# Patient Record
Sex: Male | Born: 1970 | Race: White | Hispanic: No | Smoking: Current every day smoker
Health system: Southern US, Community
[De-identification: ages and names within clinical notes are randomized; demographics above are authoritative.]

## PROBLEM LIST (undated history)

## (undated) DIAGNOSIS — M1990 Unspecified osteoarthritis, unspecified site: Secondary | ICD-10-CM

## (undated) DIAGNOSIS — K219 Gastro-esophageal reflux disease without esophagitis: Secondary | ICD-10-CM

## (undated) DIAGNOSIS — E7800 Pure hypercholesterolemia, unspecified: Secondary | ICD-10-CM

## (undated) DIAGNOSIS — L089 Local infection of the skin and subcutaneous tissue, unspecified: Secondary | ICD-10-CM

## (undated) DIAGNOSIS — Z87442 Personal history of urinary calculi: Secondary | ICD-10-CM

## (undated) DIAGNOSIS — N200 Calculus of kidney: Secondary | ICD-10-CM

## (undated) HISTORY — PX: OTHER SURGICAL HISTORY: SHX169

---

## 2002-12-16 ENCOUNTER — Emergency Department (HOSPITAL_COMMUNITY): Admission: EM | Admit: 2002-12-16 | Discharge: 2002-12-16 | Payer: Self-pay | Admitting: Emergency Medicine

## 2009-10-31 HISTORY — PX: ELBOW BURSA SURGERY: SHX615

## 2010-06-24 ENCOUNTER — Emergency Department (HOSPITAL_COMMUNITY): Admission: EM | Admit: 2010-06-24 | Discharge: 2010-06-24 | Payer: Self-pay | Admitting: Family Medicine

## 2010-11-18 ENCOUNTER — Ambulatory Visit
Admission: RE | Admit: 2010-11-18 | Discharge: 2010-11-18 | Payer: Self-pay | Source: Home / Self Care | Attending: Orthopedic Surgery | Admitting: Orthopedic Surgery

## 2010-11-22 NOTE — Op Note (Signed)
  NAME:  Joel [name], Joel [name]          ACCOUNT NO.:  [id number]  MEDICAL RECORD NO.:  [id number]          PATIENT TYPE:  AMB  LOCATION:  [facility]                         FACILITY:  [facility]  PHYSICIAN:  Marlowe Kays, M.D.  DATE OF BIRTH:  30-Jan-1971  DATE OF PROCEDURE:  11/18/2010 DATE OF DISCHARGE:                              OPERATIVE REPORT   PREOPERATIVE DIAGNOSIS:  Chronic olecranon bursitis, right elbow.  POSTOPERATIVE DIAGNOSIS:  Chronic olecranon bursitis, right elbow.  OPERATION:  Olecranon bursectomy, right elbow.  SURGEON:  Marlowe Kays, M.D.  ASSISTANT:  Nurse.  ANESTHESIA:  General.  INDICATIONS FOR PROCEDURE:  He has had a number of aspirations with steroid injections but a persistence of the chronic swelling.  When he gets swelling, he will have some symptoms of ulnar nerve irritation and we both felt that it was time to treat this surgically.  PROCEDURE:  After satisfactory general anesthesia, a pneumatic tourniquet was applied, the right arm Esmarched nonsterilely, tourniquet inflated and the right arm prepped with DuraPrep from tourniquet to wrist and draped in a sterile field.  A time-out was performed.  With arms across his chest, I marked out a curved incision around the olecranon on the radial side.  Once through the skin and subcutaneous tissue, the bursa became apparent. It was quite deflated in comparison to what we have seen.  There was central nidus of chronic inflammatory tissue which I excised as well as the bursa sac itself. The nidus, in particular, I sent to pathology to check for rheumatoid disease and we also sent portion of the specimen to look for uric acid crystals.  I then deflated the tourniquet and some minor bleeders were coagulated.  I then closed the wound after infiltrating the soft tissues with 0.5% plain Marcaine with interrupted 3-0 Vicryl, subcutaneous tissue with interrupted 4-0 nylon and mattress sutures in the skin.  A  well-padded soft dressing was applied preceded by a Betadine and Adaptic.  He was comfortably placed in a sling.  At the time of this dictation, he was on his way to the PACU in satisfactory condition with no known complications.          ______________________________ Marlowe Kays, M.D.     JA/MEDQ  D:  11/18/2010  T:  11/18/2010  Job:  [id number]  Electronically Signed by Marlowe Kays M.D. on 11/22/2010 12:40:17 PM

## 2012-03-04 ENCOUNTER — Emergency Department (HOSPITAL_BASED_OUTPATIENT_CLINIC_OR_DEPARTMENT_OTHER)
Admission: EM | Admit: 2012-03-04 | Discharge: 2012-03-04 | Disposition: A | Discharge status: Home / Self Care | Payer: Self-pay | Attending: Emergency Medicine | Admitting: Emergency Medicine

## 2012-03-04 ENCOUNTER — Emergency Department (INDEPENDENT_AMBULATORY_CARE_PROVIDER_SITE_OTHER): Payer: Self-pay

## 2012-03-04 ENCOUNTER — Encounter (HOSPITAL_BASED_OUTPATIENT_CLINIC_OR_DEPARTMENT_OTHER): Payer: Self-pay | Admitting: *Deleted

## 2012-03-04 DIAGNOSIS — W540XXA Bitten by dog, initial encounter: Secondary | ICD-10-CM | POA: Insufficient documentation

## 2012-03-04 DIAGNOSIS — S61209A Unspecified open wound of unspecified finger without damage to nail, initial encounter: Secondary | ICD-10-CM | POA: Insufficient documentation

## 2012-03-04 DIAGNOSIS — M79609 Pain in unspecified limb: Secondary | ICD-10-CM

## 2012-03-04 DIAGNOSIS — M869 Osteomyelitis, unspecified: Secondary | ICD-10-CM | POA: Insufficient documentation

## 2012-03-04 DIAGNOSIS — L089 Local infection of the skin and subcutaneous tissue, unspecified: Secondary | ICD-10-CM

## 2012-03-04 MED ORDER — CLINDAMYCIN PHOSPHATE 600 MG/50ML IV SOLN
600.0000 mg | Freq: Once | INTRAVENOUS | Status: AC
  Administered 2012-03-04: 600 mg via INTRAVENOUS
  Filled 2012-03-04: qty 50

## 2012-03-04 MED ORDER — LIDOCAINE HCL 2 % IJ SOLN
INTRAMUSCULAR | Status: AC
  Administered 2012-03-04: 14:00:00
  Filled 2012-03-04: qty 1

## 2012-03-04 MED ORDER — DOXYCYCLINE HYCLATE 100 MG PO CAPS: 100.0000 mg | ORAL_CAPSULE | Freq: Two times a day (BID) | ORAL | Status: DC

## 2012-03-04 NOTE — Discharge Instructions (Signed)
Bone and Joint Infections Joint infections are called septic or infectious arthritis. An infected joint may damage cartilage and tissue very quickly. This may destroy the joint. Bone infections (osteomyelitis) may last for years. Joints may become stiff if left untreated. Bacteria are the most common cause. Other causes include viruses and fungi, but these are more rare. Bone and joint infections usually come from injury or infection elsewhere in your body; the germs are carried to your bones or joints through the bloodstream.  CAUSES   Blood-carried germs from an infection elsewhere in your body can eventually spread to a bone or joint. The germ staphylococcus is the most common cause of both osteomyelitis and septic arthritis.   An injury can introduce germs into your bones or joints.  SYMPTOMS   Weight loss.   Tiredness.   Chills and fever.   Bone or joint pain at rest and with activity.   Tenderness when touching the area or bending the joint.   Refusal to bear weight on a leg or inability to use an arm due to pain.   Decreased range of motion in a joint.   Skin redness, warmth, and tenderness.   Open skin sores and drainage.  RISK FACTORS Children, the elderly, and those with weak immune systems are at increased risk of bone and joint infections. It is more common in people with HIV infections and with people on chemotherapy. People are also at increased risk if they have surgery where metal implants are used to stabilize the bone. Plates, screws, or artificial joints provide a surface that bacteria can stick on. Such a growth of bacteria is called biofilm. The biofilm protects bacteria from antibiotics and bodily defenses. This allows germs to multiply. Other reasons for increased risks include:   Having previous surgery or injury of a bone or joint.   Being on high-dose corticosteroids and immunosuppressive medications that weaken your body's resistance to germs.   Diabetes  and long-standing diseases.   Use of intravenous street drugs.   Being on hemodialysis.   Having a history of urinary tract infections.   Removal of your spleen (splenectomy). This weakens your immunity.   Chronic viral infections such as HIV or AIDS.   Lack of sensation such as paraplegia, quadriplegia, or spina bifida.  DIAGNOSIS   Increased numbers of white blood cells in your blood may indicate infection. Some times your caregivers are able to identify the infecting germs by testing your blood. Inflammatory markers present in your bloodstream such as an erythrocyte sedimentation rate (ESR or sed rate) or c-reactive protein (CRP) can be indicators of deep infection.   Bone scans and X-ray exams are necessary for diagnosing osteomyelitis. They may help your caregiver find the infected areas. Other studies may give more detailed information. They may help detect fluid collections around a joint, abnormal bone surfaces, or be useful in diagnosing septic arthritis. They can find soft tissue swelling and find excess fluid in an infected joint or the adjacent bone. These tests include:   Ultrasound.   CT (computerized tomography).   MRI (magnetic resonance imaging).   The best test for diagnosing a bone or joint infection is an aspiration or biopsy. Your caregiver will usually use a local anesthetic. He or she can then remove tissue from a bone injury or use a needle to take fluids from an infected joint. A local anesthetic medication numbs the area to be biopsied. Often biopsies are done in the operating room under general anesthesia. This   means you will be asleep during the procedure. Tests performed on these samples can identify an infection.  TREATMENT   Treatment can help control long-standing infections, but infections may come back.   Infections can infect any bone or joint at any age.   Bone and joint infections are rarely fatal.   Bone infection left untreated can become a  never-ending infection. It can spread to other areas of your body. It may eventually cause bone death. Reduced limb or joint function can result. In severe cases, this may require removal of a limb. Spinal osteomyelitis is very dangerous. Untreated, it may damage spinal nerves and cause death.   The most common complication of septic arthritis is osteoarthritis with pain and decreased range of motion of the joint.  Some forms of treatment may include:  If the infection is caused by bacteria, it is generally treated with antibiotics. You will likely receive the drugs through a vein (intravenously) for anywhere from 2 to 6 weeks. In some cases, especially with children, oral antibiotics following an initial intravenous dose may be effective. The treatment you receive depends on the:   Type of bacteria.   Location of the infection.   Type of surgery that might be done.   Other health conditions or issues you might have.   Your caregiver may drain soft tissue abscesses or pockets of fluid around infected bones or joints. If you have septic arthritis, your caregiver may use a needle to drain pus from the joint on a daily basis. He or she may use an arthroscope to clean the joint or may need to open the joint surgically to remove damaged tissue and infection. An arthroscope is an instrument like a thin lighted telescope. It can be used to look inside the joint.   Surgery is usually needed if the infection has become long-standing. It may also be needed if there is hardware (such as metal plates, screws, or artificial joints) inside the patient. Sometimes a bone or muscle graft is needed to fill in the open space. This promotes growth of new tissues and better blood flow to the area.  PREVENTION   Clean and disinfect wounds quickly to help prevent the start of a bone or joint infection. Get treatment for any infections to prevent spread to a bone or joint.   Do not smoke. Smoking decreases healing  rates of bone and predisposes to infection.   When given medications that suppress your immune system, use them according to your caregiver's instructions. Do not take more than prescribed for your condition.   Take good care of your feet and skin, especially if you have diabetes, decreased sensation or circulation problems.  SEEK IMMEDIATE MEDICAL CARE IF:   You cannot bear weight on a leg or use an arm, especially following a minor injury. This can be a sign of bone or joint infection.   You think you may have signs or symptoms of a bone or joint infection. Your chance of getting rid of an infection is better if treated early.  Document Released: 10/17/2005 Document Revised: 10/06/2011 Document Reviewed: 09/16/2009 [employer] Patient Information 2012 [employer]. 

## 2012-03-04 NOTE — ED Notes (Signed)
Patient was bitten on L hand one month ago, animal current on vaccinations, took keflx for 7 days bid, and finger started getting better, but finger has become swollen & hurts to touch or move

## 2012-03-04 NOTE — ED Provider Notes (Addendum)
History     CSN: [id number]  Arrival date & time 03/04/12  4098   First MD Initiated Contact with Patient 03/04/12 1020      Chief Complaint  Patient presents with  . Animal Bite    (Consider location/radiation/quality/duration/timing/severity/associated sxs/prior treatment) HPI Comments: Patient was bit by a dog 1 month ago initially the finger got infected and he took Keflex that he had at home after a dental extraction. He initially said it started to look a little better in a week and a half to 2 weeks ago started to swell and drain pus. The patient is now a 6/10 and radiates up the arm. It is worse with been  Patient is a 41 y.o. male presenting with hand pain. The history is provided by the patient.  Hand Pain This is a recurrent problem. Episode onset: 12month ago. The problem occurs constantly. The problem has been gradually worsening. Associated symptoms comments: Redness, drainage and swelling of the left index finger.   . The symptoms are aggravated by bending (palpation). The symptoms are relieved by nothing. He has tried nothing for the symptoms. The treatment provided no relief.    History reviewed. No pertinent past medical history.  History reviewed. No pertinent past surgical history.  No family history on file.  History  Substance Use Topics  . Smoking status: Never Smoker   . Smokeless tobacco: Not on file  . Alcohol Use: Yes      Review of Systems  Constitutional: Negative for fever.  Gastrointestinal: Negative for nausea, vomiting and diarrhea.  Skin: Positive for wound.  All other systems reviewed and are negative.    Allergies  Review of patient's allergies indicates no known allergies.  Home Medications   Current Outpatient Rx  Name Route Sig Dispense Refill  . PREVACID PO Oral Take by mouth.      BP 135/83  Pulse 85  Temp(Src) 99.5 F (37.5 C) (Oral)  Resp 18  Ht 5\' 10"  (1.778 m)  Wt 192 lb (87.091 kg)  BMI 27.55 kg/m2  SpO2  97%  Physical Exam  Nursing note and vitals reviewed. Constitutional: He is oriented to person, place, and time. He appears well-developed and well-nourished. No distress.  HENT:  Head: Normocephalic and atraumatic.  Musculoskeletal:       Hands: Neurological: He is alert and oriented to person, place, and time.  Skin: Skin is warm and dry.  Psychiatric: He has a normal mood and affect. His behavior is normal.    ED Course  Procedures (including critical care time)  Labs Reviewed - No data to display Dg Hand Complete Right  03/04/2012  *RADIOLOGY REPORT*  Clinical Data: Dog bite to the left index finger.  RIGHT HAND - COMPLETE 3+ VIEW 03/04/2012:  Comparison: None.  Findings: Marked soft tissue swelling involving the index finger. Lucency involving the base of the middle phalanx of the index finger, with apparent periosteal new bone formation along its volar aspect.  No other intrinsic osseous abnormalities.  No evidence of acute or subacute fracture or dislocation.  Well-preserved joint spaces.  IMPRESSION: Possible osteomyelitis involving the middle phalanx of the index finger.  Marked soft tissue swelling involving the index finger.  If imaging confirmation is felt necessary, MRI without and with contrast would be suggested.  Original Report Authenticated By: Arnell Sieving, M.D.   INCISION AND DRAINAGE Performed by: Gwyneth Sprout Consent: Verbal consent obtained. Risks and benefits: risks, benefits and alternatives were discussed Type: abscess  Body area:  Left index finger  Anesthesia: Digital block  Local anesthetic: lidocaine 2 % without epinephrine  Anesthetic total: 3 ml  Complexity: Simple   Drainage: purulent  Drainage amount:   Packing material: none Patient tolerance: Patient tolerated the procedure well with no immediate complications.     1. Osteomyelitis of finger   2. Dog bite   3. Finger infection       MDM   Patient with a dog bite  from a month ago. He had initially taken to Keflex he had at home to treat the infection and then 2 weeks ago it started getting worse. On exam here the finger is swollen and draining a purulent drainage. He denies any systemic symptoms but states the pain is starting to get worse. Plain film shows concern for possible osteomyelitis. Spoke with Dr. Lajoyce Corners hand surgery he recommended an IV dose of clindamycin and then sending the patient home with doxycycline. He will see the patient in his office tomorrow.  Tetanus shot is up-to-date      Gwyneth Sprout, MD 03/04/12 1202  Gwyneth Sprout, MD 03/04/12 1610  Gwyneth Sprout, MD 03/04/12 1309  Gwyneth Sprout, MD 03/04/12 1316

## 2012-03-06 ENCOUNTER — Encounter (HOSPITAL_COMMUNITY): Payer: Self-pay | Admitting: Pharmacy Technician

## 2012-03-07 ENCOUNTER — Other Ambulatory Visit (HOSPITAL_COMMUNITY): Payer: Self-pay | Admitting: Orthopedic Surgery

## 2012-03-07 ENCOUNTER — Encounter (HOSPITAL_COMMUNITY): Payer: Self-pay | Admitting: *Deleted

## 2012-03-08 ENCOUNTER — Ambulatory Visit (HOSPITAL_COMMUNITY): Payer: Self-pay

## 2012-03-08 ENCOUNTER — Inpatient Hospital Stay (HOSPITAL_COMMUNITY)
Admission: RE | Admit: 2012-03-08 | Discharge: 2012-03-09 | DRG: 497 | Disposition: A | Discharge status: Home / Self Care | Payer: Self-pay | Source: Ambulatory Visit | Attending: Orthopedic Surgery | Admitting: Orthopedic Surgery

## 2012-03-08 ENCOUNTER — Encounter (HOSPITAL_COMMUNITY)
Admission: RE | Disposition: A | Discharge status: Home / Self Care | Payer: Self-pay | Source: Ambulatory Visit | Attending: Orthopedic Surgery

## 2012-03-08 ENCOUNTER — Ambulatory Visit (HOSPITAL_COMMUNITY): Payer: Self-pay | Admitting: Certified Registered"

## 2012-03-08 ENCOUNTER — Encounter (HOSPITAL_COMMUNITY): Payer: Self-pay | Admitting: Certified Registered"

## 2012-03-08 ENCOUNTER — Encounter (HOSPITAL_COMMUNITY): Payer: Self-pay | Admitting: *Deleted

## 2012-03-08 DIAGNOSIS — L03019 Cellulitis of unspecified finger: Secondary | ICD-10-CM | POA: Diagnosis present

## 2012-03-08 DIAGNOSIS — L02519 Cutaneous abscess of unspecified hand: Secondary | ICD-10-CM | POA: Diagnosis present

## 2012-03-08 DIAGNOSIS — K219 Gastro-esophageal reflux disease without esophagitis: Secondary | ICD-10-CM | POA: Diagnosis present

## 2012-03-08 DIAGNOSIS — M869 Osteomyelitis, unspecified: Principal | ICD-10-CM | POA: Diagnosis present

## 2012-03-08 HISTORY — DX: Local infection of the skin and subcutaneous tissue, unspecified: L08.9

## 2012-03-08 HISTORY — DX: Gastro-esophageal reflux disease without esophagitis: K21.9

## 2012-03-08 HISTORY — PX: I & D EXTREMITY: SHX5045

## 2012-03-08 LAB — COMPREHENSIVE METABOLIC PANEL
ALT: 25 U/L (ref 0–53)
Albumin: 3.5 g/dL (ref 3.5–5.2)
Alkaline Phosphatase: 55 U/L (ref 39–117)
BUN: 14 mg/dL (ref 6–23)
Chloride: 105 mEq/L (ref 96–112)
Glucose, Bld: 89 mg/dL (ref 70–99)
Potassium: 4.3 mEq/L (ref 3.5–5.1)
Total Bilirubin: 0.3 mg/dL (ref 0.3–1.2)

## 2012-03-08 LAB — CBC
HCT: 45.6 % (ref 39.0–52.0)
Hemoglobin: 15.8 g/dL (ref 13.0–17.0)
RDW: 13.4 % (ref 11.5–15.5)
WBC: 11.6 10*3/uL — ABNORMAL HIGH (ref 4.0–10.5)

## 2012-03-08 LAB — GRAM STAIN

## 2012-03-08 LAB — PROTIME-INR: Prothrombin Time: 11.8 seconds (ref 11.6–15.2)

## 2012-03-08 LAB — APTT: aPTT: 26 seconds (ref 24–37)

## 2012-03-08 SURGERY — IRRIGATION AND DEBRIDEMENT EXTREMITY
Anesthesia: General | Site: Finger | Laterality: Left | Wound class: Dirty or Infected

## 2012-03-08 MED ORDER — HYDROMORPHONE HCL PF 1 MG/ML IJ SOLN
INTRAMUSCULAR | Status: AC
  Filled 2012-03-08: qty 1

## 2012-03-08 MED ORDER — CEFAZOLIN SODIUM 1-5 GM-% IV SOLN
INTRAVENOUS | Status: AC
  Filled 2012-03-08: qty 50

## 2012-03-08 MED ORDER — METOCLOPRAMIDE HCL 5 MG/ML IJ SOLN: 5.0000 mg | Freq: Three times a day (TID) | INTRAMUSCULAR | Status: DC | PRN

## 2012-03-08 MED ORDER — ONDANSETRON HCL 4 MG/2ML IJ SOLN: 4.0000 mg | Freq: Four times a day (QID) | INTRAMUSCULAR | Status: DC | PRN

## 2012-03-08 MED ORDER — SODIUM CHLORIDE 0.9 % IJ SOLN
10.0000 mL | INTRAMUSCULAR | Status: DC | PRN
  Administered 2012-03-09 (×2): 10 mL

## 2012-03-08 MED ORDER — ONDANSETRON HCL 4 MG PO TABS: 4.0000 mg | ORAL_TABLET | Freq: Four times a day (QID) | ORAL | Status: DC | PRN

## 2012-03-08 MED ORDER — HYDROCODONE-ACETAMINOPHEN 5-325 MG PO TABS
1.0000 | ORAL_TABLET | ORAL | Status: DC | PRN
  Administered 2012-03-08 – 2012-03-09 (×2): 2 via ORAL
  Filled 2012-03-08 (×2): qty 2

## 2012-03-08 MED ORDER — PANTOPRAZOLE SODIUM 40 MG PO TBEC
40.0000 mg | DELAYED_RELEASE_TABLET | Freq: Two times a day (BID) | ORAL | Status: DC
  Administered 2012-03-08 – 2012-03-09 (×2): 40 mg via ORAL
  Filled 2012-03-08 (×2): qty 1

## 2012-03-08 MED ORDER — GENTAMICIN SULFATE 40 MG/ML IJ SOLN
INTRAMUSCULAR | Status: DC | PRN
  Administered 2012-03-08: 160 mg

## 2012-03-08 MED ORDER — SODIUM CHLORIDE 0.9 % IR SOLN
Status: DC | PRN
  Administered 2012-03-08: 1000 mL

## 2012-03-08 MED ORDER — MIDAZOLAM HCL 5 MG/5ML IJ SOLN
INTRAMUSCULAR | Status: DC | PRN
  Administered 2012-03-08: 2 mg via INTRAVENOUS

## 2012-03-08 MED ORDER — VANCOMYCIN HCL 500 MG IV SOLR
500.0000 mg | INTRAVENOUS | Status: AC
  Administered 2012-03-08: 500 mg
  Filled 2012-03-08: qty 500

## 2012-03-08 MED ORDER — METOCLOPRAMIDE HCL 10 MG PO TABS: 5.0000 mg | ORAL_TABLET | Freq: Three times a day (TID) | ORAL | Status: DC | PRN

## 2012-03-08 MED ORDER — CEFAZOLIN SODIUM 1-5 GM-% IV SOLN
INTRAVENOUS | Status: DC | PRN
  Administered 2012-03-08: 1 g via INTRAVENOUS

## 2012-03-08 MED ORDER — CEFAZOLIN SODIUM 1-5 GM-% IV SOLN: 1.0000 g | INTRAVENOUS | Status: DC

## 2012-03-08 MED ORDER — LIDOCAINE HCL (CARDIAC) 20 MG/ML IV SOLN
INTRAVENOUS | Status: DC | PRN
  Administered 2012-03-08: 80 mg via INTRAVENOUS

## 2012-03-08 MED ORDER — ONDANSETRON HCL 4 MG/2ML IJ SOLN
INTRAMUSCULAR | Status: DC | PRN
  Administered 2012-03-08: 4 mg via INTRAVENOUS

## 2012-03-08 MED ORDER — HYDROMORPHONE HCL PF 1 MG/ML IJ SOLN
0.5000 mg | INTRAMUSCULAR | Status: DC | PRN
  Administered 2012-03-08: 0.5 mg via INTRAVENOUS
  Administered 2012-03-08: 1 mg via INTRAVENOUS
  Filled 2012-03-08 (×2): qty 1

## 2012-03-08 MED ORDER — LACTATED RINGERS IV SOLN
INTRAVENOUS | Status: DC | PRN
  Administered 2012-03-08: 07:00:00 via INTRAVENOUS

## 2012-03-08 MED ORDER — MORPHINE SULFATE 4 MG/ML IJ SOLN: 0.0500 mg/kg | INTRAMUSCULAR | Status: DC | PRN

## 2012-03-08 MED ORDER — SODIUM CHLORIDE 0.9 % IV SOLN: INTRAVENOUS | Status: DC

## 2012-03-08 MED ORDER — MUPIROCIN 2 % EX OINT
TOPICAL_OINTMENT | CUTANEOUS | Status: AC
  Administered 2012-03-08: 1 via NASAL
  Filled 2012-03-08: qty 22

## 2012-03-08 MED ORDER — HYDROMORPHONE HCL PF 1 MG/ML IJ SOLN
0.2500 mg | INTRAMUSCULAR | Status: DC | PRN
Start: 2012-03-08 — End: 2012-03-08
  Administered 2012-03-08 (×4): 0.5 mg via INTRAVENOUS

## 2012-03-08 MED ORDER — FENTANYL CITRATE 0.05 MG/ML IJ SOLN
INTRAMUSCULAR | Status: DC | PRN
  Administered 2012-03-08: 100 ug via INTRAVENOUS
  Administered 2012-03-08 (×3): 50 ug via INTRAVENOUS

## 2012-03-08 MED ORDER — LACTATED RINGERS IV SOLN: INTRAVENOUS | Status: DC

## 2012-03-08 MED ORDER — VANCOMYCIN HCL IN DEXTROSE 1-5 GM/200ML-% IV SOLN
1000.0000 mg | Freq: Two times a day (BID) | INTRAVENOUS | Status: AC
  Filled 2012-03-08: qty 200

## 2012-03-08 MED ORDER — MUPIROCIN 2 % EX OINT
TOPICAL_OINTMENT | Freq: Two times a day (BID) | CUTANEOUS | Status: DC
  Administered 2012-03-08: 1 via NASAL

## 2012-03-08 MED ORDER — OXYCODONE-ACETAMINOPHEN 5-325 MG PO TABS
1.0000 | ORAL_TABLET | ORAL | Status: DC | PRN
  Administered 2012-03-08 – 2012-03-09 (×4): 2 via ORAL
  Filled 2012-03-08 (×4): qty 2

## 2012-03-08 MED ORDER — PROPOFOL 10 MG/ML IV EMUL
INTRAVENOUS | Status: DC | PRN
  Administered 2012-03-08: 300 mg via INTRAVENOUS

## 2012-03-08 SURGICAL SUPPLY — 46 items
BANDAGE COBAN STERILE 2 (GAUZE/BANDAGES/DRESSINGS) ×2 IMPLANT
BANDAGE GAUZE 4  KLING STR (GAUZE/BANDAGES/DRESSINGS) ×2 IMPLANT
BANDAGE GAUZE ELAST BULKY 4 IN (GAUZE/BANDAGES/DRESSINGS) ×2 IMPLANT
BLADE SURG 10 STRL SS (BLADE) ×2 IMPLANT
BNDG COHESIVE 4X5 TAN STRL (GAUZE/BANDAGES/DRESSINGS) ×2 IMPLANT
BNDG COHESIVE 6X5 TAN STRL LF (GAUZE/BANDAGES/DRESSINGS) ×4 IMPLANT
BNDG GAUZE STRTCH 6 (GAUZE/BANDAGES/DRESSINGS) ×6 IMPLANT
CLOTH BEACON ORANGE TIMEOUT ST (SAFETY) ×2 IMPLANT
COTTON STERILE ROLL (GAUZE/BANDAGES/DRESSINGS) ×2 IMPLANT
COVER SURGICAL LIGHT HANDLE (MISCELLANEOUS) ×2 IMPLANT
CUFF TOURNIQUET SINGLE 18IN (TOURNIQUET CUFF) ×2 IMPLANT
CUFF TOURNIQUET SINGLE 24IN (TOURNIQUET CUFF) IMPLANT
CUFF TOURNIQUET SINGLE 34IN LL (TOURNIQUET CUFF) IMPLANT
CUFF TOURNIQUET SINGLE 44IN (TOURNIQUET CUFF) IMPLANT
DRAPE U-SHAPE 47X51 STRL (DRAPES) ×2 IMPLANT
DRSG ADAPTIC 3X8 NADH LF (GAUZE/BANDAGES/DRESSINGS) ×2 IMPLANT
DURAPREP 26ML APPLICATOR (WOUND CARE) ×2 IMPLANT
ELECT CAUTERY BLADE 6.4 (BLADE) IMPLANT
ELECT REM PT RETURN 9FT ADLT (ELECTROSURGICAL)
ELECTRODE REM PT RTRN 9FT ADLT (ELECTROSURGICAL) IMPLANT
GLOVE BIOGEL PI IND STRL 9 (GLOVE) ×1 IMPLANT
GLOVE BIOGEL PI INDICATOR 9 (GLOVE) ×1
GLOVE SURG ORTHO 9.0 STRL STRW (GLOVE) ×2 IMPLANT
GOWN PREVENTION PLUS XLARGE (GOWN DISPOSABLE) ×2 IMPLANT
GOWN SRG XL XLNG 56XLVL 4 (GOWN DISPOSABLE) ×1 IMPLANT
GOWN STRL NON-REIN XL XLG LVL4 (GOWN DISPOSABLE) ×1
HANDPIECE INTERPULSE COAX TIP (DISPOSABLE)
KIT BASIN OR (CUSTOM PROCEDURE TRAY) ×2 IMPLANT
KIT ROOM TURNOVER OR (KITS) ×2 IMPLANT
KIT STIMULAN RAPID CURE 5CC (Orthopedic Implant) ×2 IMPLANT
MANIFOLD NEPTUNE II (INSTRUMENTS) ×2 IMPLANT
NS IRRIG 1000ML POUR BTL (IV SOLUTION) ×2 IMPLANT
PACK ORTHO EXTREMITY (CUSTOM PROCEDURE TRAY) ×2 IMPLANT
PAD ARMBOARD 7.5X6 YLW CONV (MISCELLANEOUS) ×4 IMPLANT
PADDING CAST COTTON 6X4 STRL (CAST SUPPLIES) ×2 IMPLANT
SET HNDPC FAN SPRY TIP SCT (DISPOSABLE) IMPLANT
SPONGE GAUZE 4X4 12PLY (GAUZE/BANDAGES/DRESSINGS) ×2 IMPLANT
SPONGE LAP 18X18 X RAY DECT (DISPOSABLE) ×2 IMPLANT
STOCKINETTE IMPERVIOUS 9X36 MD (GAUZE/BANDAGES/DRESSINGS) ×2 IMPLANT
TOWEL OR 17X24 6PK STRL BLUE (TOWEL DISPOSABLE) ×2 IMPLANT
TOWEL OR 17X26 10 PK STRL BLUE (TOWEL DISPOSABLE) ×2 IMPLANT
TUBE ANAEROBIC SPECIMEN COL (MISCELLANEOUS) IMPLANT
TUBE CONNECTING 12X1/4 (SUCTIONS) ×2 IMPLANT
UNDERPAD 30X30 INCONTINENT (UNDERPADS AND DIAPERS) ×2 IMPLANT
WATER STERILE IRR 1000ML POUR (IV SOLUTION) ×2 IMPLANT
YANKAUER SUCT BULB TIP NO VENT (SUCTIONS) ×2 IMPLANT

## 2012-03-08 NOTE — Anesthesia Procedure Notes (Signed)
Procedure Name: LMA Insertion Date/Time: 03/08/2012 7:44 AM Performed by: Glendora Score A Pre-anesthesia Checklist: Patient identified, Emergency Drugs available, Suction available and Patient being monitored Patient Re-evaluated:Patient Re-evaluated prior to inductionOxygen Delivery Method: Circle system utilized Preoxygenation: Pre-oxygenation with 100% oxygen Intubation Type: IV induction Ventilation: Mask ventilation without difficulty and Oral airway inserted - appropriate to patient size LMA: LMA with gastric port inserted LMA Size: 5.0 Number of attempts: 1 Placement Confirmation: positive ETCO2 and breath sounds checked- equal and bilateral Tube secured with: Tape

## 2012-03-08 NOTE — Anesthesia Preprocedure Evaluation (Addendum)
Anesthesia Evaluation  Patient identified by MRN, date of birth, ID band Patient awake    Reviewed: Allergy & Precautions, H&P , NPO status , Patient's Chart, lab work & pertinent test results  Airway Mallampati: I TM Distance: >3 FB Neck ROM: Full    Dental  (+) Teeth Intact   Pulmonary Current Smoker,  breath sounds clear to auscultation        Cardiovascular negative cardio ROS  Rhythm:Regular Rate:Normal     Neuro/Psych negative neurological ROS  negative psych ROS   GI/Hepatic Neg liver ROS, GERD-  Medicated and Controlled,  Endo/Other  negative endocrine ROS  Renal/GU negative Renal ROS     Musculoskeletal negative musculoskeletal ROS (+)   Abdominal   Peds  Hematology negative hematology ROS (+)   Anesthesia Other Findings   Reproductive/Obstetrics                          Anesthesia Physical Anesthesia Plan  ASA: II  Anesthesia Plan: General   Post-op Pain Management:    Induction: Intravenous  Airway Management Planned: LMA  Additional Equipment:   Intra-op Plan:   Post-operative Plan: Extubation in OR  Informed Consent: I have reviewed the patients History and Physical, chart, labs and discussed the procedure including the risks, benefits and alternatives for the proposed anesthesia with the patient or authorized representative who has indicated his/her understanding and acceptance.   Dental advisory given  Plan Discussed with: CRNA, Anesthesiologist and Surgeon  Anesthesia Plan Comments:        Anesthesia Quick Evaluation

## 2012-03-08 NOTE — Transfer of Care (Signed)
Immediate Anesthesia Transfer of Care Note  Patient: Joel [name]  Procedure(s) Performed: Procedure(s) (LRB): IRRIGATION AND DEBRIDEMENT EXTREMITY (Left)  Patient Location: PACU  Anesthesia Type: General  Level of Consciousness: awake, alert , oriented and patient cooperative  Airway & Oxygen Therapy: Patient Spontanous Breathing and Patient connected to face mask oxygen  Post-op Assessment: Report given to PACU RN  Post vital signs: Reviewed and stable  Complications: No apparent anesthesia complications

## 2012-03-08 NOTE — Op Note (Signed)
OPERATIVE REPORT  DATE OF SURGERY: 03/08/2012  PATIENT:  Joel [name],  41 y.o. male  PRE-OPERATIVE DIAGNOSIS:  Osteomyelitis, abscess Left Index Finger  POST-OPERATIVE DIAGNOSIS:  Osteomyelitis, abscess Left Index Finger  PROCEDURE:  Procedure(s): IRRIGATION AND DEBRIDEMENT EXTREMITY Debridement of skin soft tissue and debridement of infected bone.  Placement of antibiotic beads. 500 mg vancomycin 160 mg gentamicin  SURGEON:  Surgeon(s): Nadara Mustard, MD  ANESTHESIA:   general  EBL:  Minimal ML  SPECIMEN:  Aspirate Cultures sent to pathology.  TOURNIQUET:  * No tourniquets in log *  PROCEDURE DETAILS: Patient is a 41 year old gentleman who was remotely bitten by a dog. He has had recurrent purulent abscess draining from the index finger and presented for initial evaluation and treatment. Examination radiograph shows lytic changes in the bone as well as purulent drainage from the middle phalanx of the left index finger. Patient had sausage digit swelling and presents at this time for surgical intervention. Risks and benefits were discussed including persistent infection neurovascular injury potential for amputation of the finger. Patient states he understands and wishes to proceed at this time.  Patient was brought to or room 1 and underwent a general anesthetic. After adequate levels of anesthesia were obtained patient's left upper extremity was prepped using DuraPrep and draped into a sterile field. An extensile incision was made over the middle phalanx. There was purulence beneath the draining sinus tract. This was cultured. The extensor tendon was retracted laterally there was a large lytic hole in the bone with a cloaca and sequestration of osteomyelitis. A curette was used to debride out within the middle phalanx. The cortical erosion was 7 mm in diameter. The medullary canal of the bone was packed with the antibiotic beads. The wound was further irrigated and debrided.  The skin was closed loosely with 2-0 nylon. The wound was covered with Adaptic orthopedic sponges Kerlix and Coban. Patient was extubated taken to the PACU in stable condition. Plan for placement of a PICC line and 6 weeks of IV antibiotics.  PLAN OF CARE: Admit to inpatient   PATIENT DISPOSITION:  PACU - hemodynamically stable.   Nadara Mustard, MD 03/08/2012 8:18 AM

## 2012-03-08 NOTE — Anesthesia Postprocedure Evaluation (Signed)
  Anesthesia Post-op Note  Patient: Joel [name]  Procedure(s) Performed: Procedure(s) (LRB): IRRIGATION AND DEBRIDEMENT EXTREMITY (Left)  Patient Location: PACU  Anesthesia Type: General  Level of Consciousness: awake  Airway and Oxygen Therapy: Patient Spontanous Breathing  Post-op Pain: mild  Post-op Assessment: Post-op Vital signs reviewed  Post-op Vital Signs: Reviewed  Complications: No apparent anesthesia complications

## 2012-03-08 NOTE — Progress Notes (Signed)
Report given to mark rn as caregiver 

## 2012-03-08 NOTE — Preoperative (Signed)
Beta Blockers   Reason not to administer Beta Blockers:Not Applicable 

## 2012-03-08 NOTE — H&P (Signed)
Joel [name] is an 41 y.o. male.   Chief Complaint: abscess left index finger HPI: chronic infection left index finger  Past Medical History  Diagnosis Date  . Infection of index finger     dog bite  . GERD (gastroesophageal reflux disease)     Past Surgical History  Procedure Date  . Elbow bursa surgery     Right  . Neck cyst removed     History reviewed. No pertinent family history. Social History:  reports that he has never smoked. He does not have any smokeless tobacco history on file. He reports that he drinks alcohol. He reports that he does not use illicit drugs.  Allergies: No Known Allergies  Medications Prior to Admission  Medication Sig Dispense Refill  . doxycycline (VIBRAMYCIN) 100 MG capsule Take 1 capsule (100 mg total) by mouth 2 (two) times daily.  20 capsule  0  . omeprazole (PRILOSEC) 20 MG capsule Take 20 mg by mouth 2 (two) times daily.        Results for orders placed during the hospital encounter of 03/08/12 (from the past 48 hour(s))  APTT     Status: Normal   Collection Time   03/08/12  6:37 AM      Component Value Range Comment   aPTT 26  24 - 37 (seconds)   CBC     Status: Abnormal   Collection Time   03/08/12  6:37 AM      Component Value Range Comment   WBC 11.6 (*) 4.0 - 10.5 (K/uL)    RBC 4.67  4.22 - 5.81 (MIL/uL)    Hemoglobin 15.8  13.0 - 17.0 (g/dL)    HCT 16.1  09.6 - 04.5 (%)    MCV 97.6  78.0 - 100.0 (fL)    MCH 33.8  26.0 - 34.0 (pg)    MCHC 34.6  30.0 - 36.0 (g/dL)    RDW 40.9  81.1 - 91.4 (%)    Platelets 168  150 - 400 (K/uL)   PROTIME-INR     Status: Normal   Collection Time   03/08/12  6:37 AM      Component Value Range Comment   Prothrombin Time 11.8  11.6 - 15.2 (seconds)    INR 0.85  0.00 - 1.49     Dg Chest 2 View  03/08/2012  *RADIOLOGY REPORT*  Clinical Data: Preoperative chest radiograph for left index finger infection.  CHEST - 2 VIEW  Comparison: None.  Findings: The lungs are well-aerated and clear.  There  is no evidence of focal opacification, pleural effusion or pneumothorax.  The heart is normal in size; the mediastinal contour is within normal limits.  No acute osseous abnormalities are seen.  IMPRESSION: No acute cardiopulmonary process seen.  Original Report Authenticated By: Tonia Ghent, M.D.    Review of Systems  All other systems reviewed and are negative.    Blood pressure 111/64, pulse 69, temperature 97.9 F (36.6 C), temperature source Oral, resp. rate 18, height 5\' 10"  (1.778 m), weight 92.987 kg (205 lb), SpO2 95.00%. Physical Exam  purulent drainage and osteomyelitis left indfex finger Assessment/Plan Osteo and abscess left index finger Plan for I&D place antibiotic beads, PICC line and 6 weeks abx  Joel [name] V 03/08/2012, 7:25 AM

## 2012-03-09 MED ORDER — OXYCODONE-ACETAMINOPHEN 5-325 MG PO TABS: 1.0000 | ORAL_TABLET | ORAL | Status: AC | PRN

## 2012-03-09 MED ORDER — VANCOMYCIN HCL 1000 MG IV SOLR: 1500.0000 mg | Freq: Two times a day (BID) | INTRAVENOUS | Status: DC

## 2012-03-09 MED ORDER — HEPARIN SOD (PORK) LOCK FLUSH 100 UNIT/ML IV SOLN
250.0000 [IU] | INTRAVENOUS | Status: AC | PRN
  Administered 2012-03-09: 500 [IU]

## 2012-03-09 MED ORDER — VANCOMYCIN HCL IN DEXTROSE 1-5 GM/200ML-% IV SOLN: 1000.0000 mg | Freq: Two times a day (BID) | INTRAVENOUS | Status: DC

## 2012-03-09 MED ORDER — VANCOMYCIN HCL 1000 MG IV SOLR
1500.0000 mg | Freq: Once | INTRAVENOUS | Status: AC
  Administered 2012-03-09: 1500 mg via INTRAVENOUS
  Filled 2012-03-09: qty 1500

## 2012-03-09 NOTE — Progress Notes (Signed)
CARE MANAGEMENT NOTE 03/09/2012  Comments:  03/09/12 1400  Vance Peper, RN BSN CM Received cal from [facility] They will not be able to service the patient. Contacted [facility]- they will provide antibiotics and RN. Explained changes to patient.

## 2012-03-09 NOTE — Discharge Instructions (Signed)
Home Health to be provided by [facility] [phone] 

## 2012-03-09 NOTE — Progress Notes (Signed)
CARE MANAGEMENT NOTE  03/08/2012  Patient: Joel [name], Joel [name] Account Number: [id number]  Date Initiated: 03/08/2012 Documentation initiated by: Vance Peper  Subjective/Objective Assessment:  41 yr old male adm for osteomyelitis of left index finger. Pt underwent I & D and placement of antibiotic beads to left index finger.   Action/Plan:  Patient will receive IV Vanc for 6 weeks at home. PICC line to be placed. [employer] @ [place] [phone]. Faxed orders and OP report, H&P to [phone].   Anticipated DC Date: 03/10/2012 Anticipated DC Plan: HOME W HOME HEALTH SERVICES  DC Planning Services   CM consult    [facility] Choice   HOME HEALTH   Choice offered to / List presented to:  [facility] arranged   HH-1 RN    [facility] agency   OTHER - SEE NOTE   Status of service: In process, will continue to follow  Comments: 03/08/12 1300 Vance Peper, RN BSN Nurse Case Manager  Spoke with patient regarding home health needs. He stated that this has to be covered by a homeowner's policy. Provided information for me to contact [phone]. Cline Crock, she asked me to have the Home Health Agency liasion call her. Spoke with [facility] @ [phone], fax [phone]. She will be able to service patient and will contact Kennon Rounds for further instructions. Faxed orders to [facility].

## 2012-03-09 NOTE — Discharge Summary (Signed)
Physician Discharge Summary  Patient ID: Joel [name] MRN: [id number] DOB/AGE: 42/09/1971 42 y.o.  Admit date: 03/08/2012 Discharge date: 03/09/2012  Admission Diagnoses: Osteomyelitis abscess left index finger with sequestrum and abscess.  Discharge Diagnoses: Same Active Problems:  * No active hospital problems. *    Discharged Condition: stable  Hospital Course: Patient's hospital course was essentially unremarkable. He underwent irrigation and debridement of the left index finger he had a pair of abscess within the canal of the middle phalanx. There was complete destruction of the cortical bone. And a large abscess. He underwent debridement packing of the canal of the middle phalanx with antibiotic beads with vancomycin and gentamicin started on IV vancomycin and discharged to home with IV vancomycin for 6 weeks  Consults: None  Significant Diagnostic Studies: labs: Routine labs  Treatments: surgery: Please see operative note  Discharge Exam: Blood pressure 121/71, pulse 72, temperature 98.5 F (36.9 C), temperature source Oral, resp. rate 20, height 5\' 10"  (1.778 m), weight 92.987 kg (205 lb), SpO2 96.00%. Incision/Wound: incision clean and dry at time of discharge  Disposition: 01-Home or Self Care  Discharge Orders    Future Orders Please Complete By Expires   Ambulatory referral to Home Health      Comments:   Please evaluate Joel [name] for admission to [facility].  Disciplines requested: Nursing  Services to provide: IV Antibiotics  Physician to follow patient's care (the person listed here will be responsible for signing ongoing orders): Referring Provider  Requested Start of Care Date: Tomorrow  Special Instructions:  IV vancomycin x6 weeks pharmacy to monitor and dose.   Diet - low sodium heart healthy      Call MD / Call 911      Comments:   If you experience chest pain or shortness of breath, CALL 911 and be transported to the hospital  emergency room.  If you develope a fever above 101 F, pus (white drainage) or increased drainage or redness at the wound, or calf pain, call your surgeon's office.   Constipation Prevention      Comments:   Drink plenty of fluids.  Prune juice may be helpful.  You may use a stool softener, such as Colace (over the counter) 100 mg twice a day.  Use MiraLax (over the counter) for constipation as needed.   Increase activity slowly as tolerated      Weight Bearing as taught in Physical Therapy      Comments:   Use a walker or crutches as instructed.     Medication List  As of 03/09/2012  6:35 AM   STOP taking these medications         doxycycline 100 MG capsule         TAKE these medications         oxyCODONE-acetaminophen 5-325 MG per tablet   Commonly known as: PERCOCET   Take 1 tablet by mouth every 4 (four) hours as needed for pain.      vancomycin 1 GM/200ML Soln   Commonly known as: VANCOCIN   Inject 200 mLs (1,000 mg total) into the vein every 12 (twelve) hours.         ASK your doctor about these medications         omeprazole 20 MG capsule   Commonly known as: PRILOSEC   Take 20 mg by mouth 2 (two) times daily.             Signed: Nadara Mustard 03/09/2012,  6:35 AM

## 2012-03-09 NOTE — Progress Notes (Signed)
With this patients wt 93 kg and CrCl- 134.2 Pharmacy would recommend vancomycin 1 gm q8h after this mornings dose of 1500 mg. thanks

## 2012-03-09 NOTE — Evaluation (Signed)
Occupational Therapy Evaluation Patient Details Name: Joel [name] MRN: [id number] DOB: 08-Feb-1971 Today's Date: 03/09/2012 Time: 9811-9147 OT Time Calculation (min): 51 min  OT Assessment / Plan / Recommendation Clinical Impression  This 41 y.o. male admitted for dog bit and osteomyelitis.  Resting splint fabricated for Lt. index finger.  No further OT needs identified.  All education completed.    OT Assessment  Patient does not need any further OT services    Follow Up Recommendations  No OT follow up    Barriers to Discharge      Equipment Recommendations  None recommended by OT    Recommendations for Other Services    Frequency       Precautions / Restrictions Precautions Precautions: None Required Braces or Orthoses:  (OT ordered for splint Lt. index finger) Restrictions Weight Bearing Restrictions: No       ADL  ADL Comments: Pt. with bulky dressing in place.  Spoke with Dr. Lajoyce Corners via phone, who ok'd dressing to be removed and resized.  Bulky dressing removed.  Wounds with drainage.  Finger re-dressed with petroleum based dressing followed by 2x2's.  Pt. was instructed in dressing change with supplies provided.  Instructed to clean area surrounding incisions with sterile water only.  Do not clean over the sutures, or the wounds themselves.  Pt inquired about using peroxide, instructed him to use sterile water only.   A resting splint was fabricated and pt. was instructed in wear and care.  Pt. able to don/doff independently.  Extra strapping supplies provided, and dressing covered in Coban to keep clean given work issues/conditions.  Pt. advised to change dressing only when dressing soiled.      OT Diagnosis:    OT Problem List:   OT Treatment Interventions:     OT Goals    Visit Information  Last OT Received On: 03/09/12    Subjective Data  Subjective: "I've got to get back to work" Patient Stated Goal: To hand better   Prior Functioning  Prior  Function Level of Independence: Independent Able to Take Stairs?: Yes Driving: Yes Vocation: Full time employment Comments: works as a Conservation officer, historic buildings: No difficulties Dominant Hand: Right    Cognition  Overall Cognitive Status: Appears within functional limits for tasks assessed/performed Behavior During Session: [facility] for tasks performed    Extremity/Trunk Assessment Right Upper Extremity Assessment RUE ROM/Strength/Tone: Within functional levels RUE Sensation: WFL - Light Touch RUE Coordination: WFL - gross/fine motor Left Upper Extremity Assessment LUE ROM/Strength/Tone: Deficits (index finger) LUE ROM/Strength/Tone Deficits: Index finger with moderate edema.  Diminished sensation   Mobility     Exercise    Balance    End of Session OT - End of Session Equipment Utilized During Treatment:  (splinting supplies) Activity Tolerance: Patient tolerated treatment well Patient left: in bed;with call bell/phone within reach   Teletha Petrea M 03/09/2012, 1:08 PM

## 2012-03-09 NOTE — Progress Notes (Signed)
CARE MANAGEMENT NOTE 03/09/2012  Comments:  03/09/12 1130 Vance Peper, RN BSN Spoke with [facility]asion. She states that [facility] will care for the patient and [facility] will provide the medication. patient has concerns regarding being able to travel for work, Dr. will provide statement that he can do so .

## 2012-03-12 ENCOUNTER — Encounter (HOSPITAL_COMMUNITY): Payer: Self-pay | Admitting: Orthopedic Surgery

## 2012-03-13 LAB — ANAEROBIC CULTURE

## 2012-03-13 LAB — TISSUE CULTURE

## 2012-05-01 ENCOUNTER — Other Ambulatory Visit (HOSPITAL_COMMUNITY): Payer: Self-pay | Admitting: Orthopedic Surgery

## 2012-05-02 ENCOUNTER — Inpatient Hospital Stay (HOSPITAL_COMMUNITY): Admission: RE | Admit: 2012-05-02 | Discharge: 2012-05-02 | Payer: Self-pay | Source: Ambulatory Visit

## 2012-05-02 ENCOUNTER — Encounter (HOSPITAL_COMMUNITY): Payer: Self-pay | Admitting: *Deleted

## 2012-05-02 ENCOUNTER — Other Ambulatory Visit (HOSPITAL_COMMUNITY): Payer: Self-pay

## 2012-05-02 NOTE — Pre-Procedure Instructions (Signed)
20 IBN STIEF  05/02/2012   Your procedure is scheduled on:  Fri, July 5 @ 2:30 PM  Report to [facility] at 12:30 PM.  Call this number if you have problems the morning of surgery: [phone]   Remember:   Do not eat food:After Midnight.  Take these medicines the morning of surgery with A SIP OF WATER: Omeprazole(Prilosec)   Do not wear jewelry  Do not wear lotions, powders, or cologne  Men may shave face and neck.  Do not bring valuables to the hospital.  Contacts, dentures or bridgework may not be worn into surgery.  Leave suitcase in the car. After surgery it may be brought to your room.  For patients admitted to the hospital, checkout time is 11:00 AM the day of discharge.   Patients discharged the day of surgery will not be allowed to drive home.  Special Instructions: CHG Shower Use Special Wash: 1/2 bottle night before surgery and 1/2 bottle morning of surgery.   Please read over the following fact sheets that you were given: Pain Booklet, Coughing and Deep Breathing, MRSA Information and Surgical Site Infection Prevention

## 2012-05-02 NOTE — Progress Notes (Addendum)
Pt doesn't have a cardiologist  Pt doesn't have a medical MD  Denies ever having a heart cath/echo  Stress test >29yrs(only job related was not having any problems)  EKG and CXR in epic from May 2013

## 2012-05-03 MED ORDER — DEXTROSE 5 % IV SOLN
3.0000 g | INTRAVENOUS | Status: DC
  Filled 2012-05-03: qty 30

## 2012-05-03 MED ORDER — CEFAZOLIN SODIUM-DEXTROSE 2-3 GM-% IV SOLR
2.0000 g | INTRAVENOUS | Status: AC
  Administered 2012-05-04: 2 g via INTRAVENOUS
  Filled 2012-05-03 (×2): qty 50

## 2012-05-04 ENCOUNTER — Encounter (HOSPITAL_COMMUNITY): Payer: Self-pay | Admitting: Certified Registered Nurse Anesthetist

## 2012-05-04 ENCOUNTER — Ambulatory Visit (HOSPITAL_COMMUNITY): Payer: Self-pay | Admitting: Certified Registered Nurse Anesthetist

## 2012-05-04 ENCOUNTER — Ambulatory Visit (HOSPITAL_COMMUNITY)
Admission: RE | Admit: 2012-05-04 | Discharge: 2012-05-04 | Disposition: A | Discharge status: Home / Self Care | Payer: Self-pay | Source: Ambulatory Visit | Attending: Orthopedic Surgery | Admitting: Orthopedic Surgery

## 2012-05-04 ENCOUNTER — Encounter (HOSPITAL_COMMUNITY)
Admission: RE | Disposition: A | Discharge status: Home / Self Care | Payer: Self-pay | Source: Ambulatory Visit | Attending: Orthopedic Surgery

## 2012-05-04 ENCOUNTER — Encounter (HOSPITAL_COMMUNITY): Payer: Self-pay | Admitting: *Deleted

## 2012-05-04 DIAGNOSIS — M86649 Other chronic osteomyelitis, unspecified hand: Secondary | ICD-10-CM | POA: Insufficient documentation

## 2012-05-04 DIAGNOSIS — M869 Osteomyelitis, unspecified: Secondary | ICD-10-CM

## 2012-05-04 LAB — COMPREHENSIVE METABOLIC PANEL WITH GFR
ALT: 21 U/L (ref 0–53)
AST: 21 U/L (ref 0–37)
Albumin: 3.6 g/dL (ref 3.5–5.2)
Alkaline Phosphatase: 53 U/L (ref 39–117)
BUN: 14 mg/dL (ref 6–23)
CO2: 23 meq/L (ref 19–32)
Calcium: 8.8 mg/dL (ref 8.4–10.5)
Chloride: 103 meq/L (ref 96–112)
Creatinine, Ser: 0.92 mg/dL (ref 0.50–1.35)
GFR calc Af Amer: >90 mL/min
GFR calc non Af Amer: >90 mL/min
Glucose, Bld: 91 mg/dL (ref 70–99)
Potassium: 4.2 meq/L (ref 3.5–5.1)
Sodium: 136 meq/L (ref 135–145)
Total Bilirubin: 0.3 mg/dL (ref 0.3–1.2)
Total Protein: 6.2 g/dL (ref 6.0–8.3)

## 2012-05-04 LAB — CBC
HCT: 44.2 % (ref 39.0–52.0)
Hemoglobin: 15.5 g/dL (ref 13.0–17.0)
MCH: 34 pg (ref 26.0–34.0)
MCHC: 35.1 g/dL (ref 30.0–36.0)
MCV: 96.9 fL (ref 78.0–100.0)
Platelets: 175 K/uL (ref 150–400)
RBC: 4.56 MIL/uL (ref 4.22–5.81)
RDW: 12.9 % (ref 11.5–15.5)
WBC: 10.2 K/uL (ref 4.0–10.5)

## 2012-05-04 SURGERY — OPEN REDUCTION INTERNAL FIXATION (ORIF) HAND
Anesthesia: General | Site: Finger | Laterality: Left | Wound class: Clean

## 2012-05-04 MED ORDER — MUPIROCIN 2 % EX OINT
TOPICAL_OINTMENT | CUTANEOUS | Status: AC
  Filled 2012-05-04: qty 22

## 2012-05-04 MED ORDER — BUPIVACAINE HCL 0.25 % IJ SOLN
INTRAMUSCULAR | Status: DC | PRN
  Administered 2012-05-04: 10 mL

## 2012-05-04 MED ORDER — SODIUM CHLORIDE 0.9 % IR SOLN
Status: DC | PRN
  Administered 2012-05-04: 1000 mL

## 2012-05-04 MED ORDER — MUPIROCIN 2 % EX OINT
TOPICAL_OINTMENT | Freq: Once | CUTANEOUS | Status: AC
  Administered 2012-05-04: 12:00:00 via NASAL

## 2012-05-04 MED ORDER — GENTAMICIN SULFATE 40 MG/ML IJ SOLN
INTRAMUSCULAR | Status: AC
  Filled 2012-05-04: qty 4

## 2012-05-04 MED ORDER — OXYCODONE-ACETAMINOPHEN 5-325 MG PO TABS: 1.0000 | ORAL_TABLET | ORAL | Status: AC | PRN

## 2012-05-04 MED ORDER — GENTAMICIN SULFATE 40 MG/ML IJ SOLN
INTRAMUSCULAR | Status: DC | PRN
  Administered 2012-05-04: 120 mg via INTRAMUSCULAR

## 2012-05-04 MED ORDER — LIDOCAINE HCL (CARDIAC) 20 MG/ML IV SOLN
INTRAVENOUS | Status: DC | PRN
  Administered 2012-05-04: 70 mg via INTRAVENOUS

## 2012-05-04 MED ORDER — PROPOFOL 10 MG/ML IV EMUL
INTRAVENOUS | Status: DC | PRN
  Administered 2012-05-04: 200 mg via INTRAVENOUS

## 2012-05-04 MED ORDER — BUPIVACAINE HCL (PF) 0.25 % IJ SOLN
INTRAMUSCULAR | Status: AC
  Filled 2012-05-04: qty 30

## 2012-05-04 MED ORDER — VANCOMYCIN HCL 500 MG IV SOLR
500.0000 mg | INTRAVENOUS | Status: AC
  Administered 2012-05-04: 500 mg
  Filled 2012-05-04: qty 500

## 2012-05-04 MED ORDER — MIDAZOLAM HCL 5 MG/5ML IJ SOLN
INTRAMUSCULAR | Status: DC | PRN
  Administered 2012-05-04: 2 mg via INTRAVENOUS

## 2012-05-04 MED ORDER — LACTATED RINGERS IV SOLN
INTRAVENOUS | Status: DC | PRN
  Administered 2012-05-04: 14:00:00 via INTRAVENOUS

## 2012-05-04 MED ORDER — FENTANYL CITRATE 0.05 MG/ML IJ SOLN
INTRAMUSCULAR | Status: DC | PRN
Start: 2012-05-04 — End: 2012-05-04
  Administered 2012-05-04: 100 ug via INTRAVENOUS
  Administered 2012-05-04: 50 ug via INTRAVENOUS

## 2012-05-04 SURGICAL SUPPLY — 54 items
BANDAGE GAUZE 4  KLING STR (GAUZE/BANDAGES/DRESSINGS) IMPLANT
BANDAGE GAUZE ELAST BULKY 4 IN (GAUZE/BANDAGES/DRESSINGS) ×2 IMPLANT
BLADE AVERAGE 25X9 (BLADE) IMPLANT
BLADE MINI RND TIP GREEN BEAV (BLADE) IMPLANT
BNDG COHESIVE 1X5 TAN STRL LF (GAUZE/BANDAGES/DRESSINGS) ×2 IMPLANT
BNDG COHESIVE 6X5 TAN STRL LF (GAUZE/BANDAGES/DRESSINGS) IMPLANT
BNDG ESMARK 4X9 LF (GAUZE/BANDAGES/DRESSINGS) IMPLANT
BNDG GAUZE STRTCH 6 (GAUZE/BANDAGES/DRESSINGS) IMPLANT
CLOTH BEACON ORANGE TIMEOUT ST (SAFETY) ×2 IMPLANT
CORDS BIPOLAR (ELECTRODE) IMPLANT
COVER SURGICAL LIGHT HANDLE (MISCELLANEOUS) ×2 IMPLANT
CUFF TOURNIQUET SINGLE 18IN (TOURNIQUET CUFF) IMPLANT
CUFF TOURNIQUET SINGLE 24IN (TOURNIQUET CUFF) IMPLANT
CUFF TOURNIQUET SINGLE 34IN LL (TOURNIQUET CUFF) IMPLANT
CUFF TOURNIQUET SINGLE 44IN (TOURNIQUET CUFF) IMPLANT
DRAPE U-SHAPE 47X51 STRL (DRAPES) ×2 IMPLANT
DRSG ADAPTIC 3X8 NADH LF (GAUZE/BANDAGES/DRESSINGS) ×2 IMPLANT
DURAPREP 26ML APPLICATOR (WOUND CARE) ×2 IMPLANT
ELECT REM PT RETURN 9FT ADLT (ELECTROSURGICAL)
ELECTRODE REM PT RTRN 9FT ADLT (ELECTROSURGICAL) IMPLANT
GAUZE SPONGE 2X2 8PLY STRL LF (GAUZE/BANDAGES/DRESSINGS) IMPLANT
GLOVE BIOGEL PI IND STRL 9 (GLOVE) ×1 IMPLANT
GLOVE BIOGEL PI INDICATOR 9 (GLOVE) ×1
GLOVE ECLIPSE 6.5 STRL STRAW (GLOVE) ×2 IMPLANT
GLOVE SURG ORTHO 9.0 STRL STRW (GLOVE) ×2 IMPLANT
GOWN PREVENTION PLUS XLARGE (GOWN DISPOSABLE) IMPLANT
GOWN SRG XL XLNG 56XLVL 4 (GOWN DISPOSABLE) ×1 IMPLANT
GOWN STRL NON-REIN XL XLG LVL4 (GOWN DISPOSABLE) ×1
K-WIRE .062 ×4 IMPLANT
KIT BASIN OR (CUSTOM PROCEDURE TRAY) ×2 IMPLANT
KIT ROOM TURNOVER OR (KITS) ×2 IMPLANT
KIT STIMULAN RAPID CURE 5CC (Orthopedic Implant) ×2 IMPLANT
MANIFOLD NEPTUNE II (INSTRUMENTS) IMPLANT
NDL SAFETY ECLIPSE 18X1.5 (NEEDLE) ×1 IMPLANT
NEEDLE HYPO 18GX1.5 SHARP (NEEDLE) ×1
NEEDLE HYPO 25GX1X1/2 BEV (NEEDLE) IMPLANT
NS IRRIG 1000ML POUR BTL (IV SOLUTION) ×2 IMPLANT
PACK ORTHO EXTREMITY (CUSTOM PROCEDURE TRAY) ×2 IMPLANT
PAD ARMBOARD 7.5X6 YLW CONV (MISCELLANEOUS) ×2 IMPLANT
PAD CAST 4YDX4 CTTN HI CHSV (CAST SUPPLIES) IMPLANT
PADDING CAST COTTON 4X4 STRL (CAST SUPPLIES)
SPECIMEN JAR SMALL (MISCELLANEOUS) ×2 IMPLANT
SPONGE GAUZE 2X2 STER 10/PKG (GAUZE/BANDAGES/DRESSINGS)
SPONGE GAUZE 4X4 12PLY (GAUZE/BANDAGES/DRESSINGS) ×2 IMPLANT
SUCTION FRAZIER TIP 10 FR DISP (SUCTIONS) ×2 IMPLANT
SUT ETHILON 2 0 PSLX (SUTURE) IMPLANT
SUT ETHILON 3 0 FSL (SUTURE) ×2 IMPLANT
SUT VIC AB 2-0 FS1 27 (SUTURE) IMPLANT
SYR 5ML LL (SYRINGE) ×2 IMPLANT
SYR CONTROL 10ML LL (SYRINGE) IMPLANT
TOWEL OR 17X24 6PK STRL BLUE (TOWEL DISPOSABLE) ×2 IMPLANT
TOWEL OR 17X26 10 PK STRL BLUE (TOWEL DISPOSABLE) ×2 IMPLANT
TUBE CONNECTING 12X1/4 (SUCTIONS) ×2 IMPLANT
WATER STERILE IRR 1000ML POUR (IV SOLUTION) ×2 IMPLANT

## 2012-05-04 NOTE — H&P (Signed)
Joel [name] is an 41 y.o. male.   Chief Complaint: For many infection and pain left index finger PIP joint HPI: Patient is a 41 year old gentleman who I chronic osteomyelitis of the PIP joint secondary to a dog bite. After initial debridement patient has had progressive collapse and progressive bony infection and presents at this time for straightening of the finger resection of the PIP joint and placement of antibiotic beads.  Past Medical History  Diagnosis Date  . Infection of index finger     dog bite  . GERD (gastroesophageal reflux disease)     takes Prilosec bid    Past Surgical History  Procedure Date  . Elbow bursa surgery 2011    Right  . Neck cyst removed     at age 36  . I&d extremity 03/08/2012    Procedure: IRRIGATION AND DEBRIDEMENT EXTREMITY;  Surgeon: Nadara Mustard, MD;  Location: [facility] OR;  Service: Orthopedics;  Laterality: Left;  Left Index Finger Irrigation and Debridement, Place Antibiotic Beads    History reviewed. No pertinent family history. Social History:  reports that he has been passively smoking Cigars.  He does not have any smokeless tobacco history on file. He reports that he does not drink alcohol or use illicit drugs.  Allergies: No Known Allergies  Medications Prior to Admission  Medication Sig Dispense Refill  . omeprazole (PRILOSEC) 20 MG capsule Take 20 mg by mouth 2 (two) times daily.      Marland Kitchen oxyCODONE-acetaminophen (PERCOCET) 5-325 MG per tablet Take 1 tablet by mouth every 4 (four) hours as needed.        Results for orders placed during the hospital encounter of 05/04/12 (from the past 48 hour(s))  CBC     Status: Normal   Collection Time   05/04/12 12:00 PM      Component Value Range Comment   WBC 10.2  4.0 - 10.5 K/uL    RBC 4.56  4.22 - 5.81 MIL/uL    Hemoglobin 15.5  13.0 - 17.0 g/dL    HCT 16.1  09.6 - 04.5 %    MCV 96.9  78.0 - 100.0 fL    MCH 34.0  26.0 - 34.0 pg    MCHC 35.1  30.0 - 36.0 g/dL    RDW 40.9  81.1 - 91.4 %      Platelets 175  150 - 400 K/uL   COMPREHENSIVE METABOLIC PANEL     Status: Normal   Collection Time   05/04/12 12:00 PM      Component Value Range Comment   Sodium 136  135 - 145 mEq/L    Potassium 4.2  3.5 - 5.1 mEq/L    Chloride 103  96 - 112 mEq/L    CO2 23  19 - 32 mEq/L    Glucose, Bld 91  70 - 99 mg/dL    BUN 14  6 - 23 mg/dL    Creatinine, Ser 7.82  0.50 - 1.35 mg/dL    Calcium 8.8  8.4 - 95.6 mg/dL    Total Protein 6.2  6.0 - 8.3 g/dL    Albumin 3.6  3.5 - 5.2 g/dL    AST 21  0 - 37 U/L    ALT 21  0 - 53 U/L    Alkaline Phosphatase 53  39 - 117 U/L    Total Bilirubin 0.3  0.3 - 1.2 mg/dL    GFR calc non Af Amer >90  >90 mL/min    GFR  calc Af Amer >90  >90 mL/min    No results found.  Review of Systems  All other systems reviewed and are negative.    Blood pressure 114/76, pulse 78, temperature 98.4 F (36.9 C), temperature source Oral, resp. rate 20, height 5\' 11"  (1.803 m), weight 92.987 kg (205 lb), SpO2 96.00%. Physical Exam on examination patient has an angular deformity of the PIP joint left index finger. There is active motor function of the DIP joint but there is a draining sinus tract in the PIP joint and radiographs shows destructive lytic changes and collapse of the bone secondary to the chronic infection.  Assessment/Plan Assessment: Osteomyelitis with ankle deformity left index finger PIP joint with drainage.  Plan: Will plan for repeat irrigation and debridement resection of bone placement of antibiotic beads placement of a intramedullary pin. Risk and benefits were discussed including infection neurovascular injury persistent pain persistent infection need for additional surgery. Patient states he understands was to proceed at this time.  [name],Joel V 05/04/2012, 1:38 PM

## 2012-05-04 NOTE — Preoperative (Signed)
Beta Blockers   Reason not to administer Beta Blockers:Not Applicable 

## 2012-05-04 NOTE — Op Note (Signed)
OPERATIVE REPORT  DATE OF SURGERY: 05/04/2012  PATIENT:  Joel [name],  41 y.o. male  PRE-OPERATIVE DIAGNOSIS:  Chronic osteomyelitis with collapse left index PIP  POST-OPERATIVE DIAGNOSIS:  Chronic osteomyelitis with collapse left index PIP  PROCEDURE:  Procedure(s): OPEN REDUCTION INTERNAL FIXATION (ORIF) PIP joint left index finger. Placement of antibiotic beads with vancomycin and gentamicin  SURGEON:  Surgeon(s): Nadara Mustard, MD  ANESTHESIA:   local and general  EBL:  min ML  SPECIMEN:  No Specimen  TOURNIQUET:  * No tourniquets in log *  PROCEDURE DETAILS: Patient is a 41 year old gentleman who presents status post irrigation and debridement for chronic osteomyelitis of the PIP joint of the left index finger. Patient has had progressive collapse progressive bony destruction with persistent drainage and presents at this time for fusion of the PIP joint with placement of antibiotic beads. Risks and benefits were discussed including infection neurovascular injury nonhealing of the wound potential for amputation of the finger. Patient states he understands was pursued this time. Description of procedure patient was brought to or room and underwent a general anesthetic. After adequate levels and anesthesia were obtained patient's left upper extremity was prepped using DuraPrep and draped into a sterile field. The previous extensile incision over the dorsum of the PIP joint was used. This was bluntly carried down to the PIP joint. There was loose destruct destructive cortical bone which was removed the joint was subluxed and dislocated. The joint was in a 45 angle. The joint was reduced and stabilized with a 0.0625 K wire with the finger PIP joint straight and AP and lateral planes. The wound was irrigated. The wound was packed with vancomycin and gentamicin antibiotic beads. The skin was closing 3-0 nylon. The wound was covered with Adaptic 4 x 4's and a Coban dressing. Patient  was extubated taken to the PACU in stable condition. He underwent a digital block with 10 cc of quarter percent Marcaine plain. He is given a prescription for Percocet for pain plan to followup in the office in 2 weeks.  PLAN OF CARE: Discharge to home after PACU  PATIENT DISPOSITION:  PACU - hemodynamically stable.   Nadara Mustard, MD 05/04/2012 3:35 PM

## 2012-05-04 NOTE — Transfer of Care (Signed)
Immediate Anesthesia Transfer of Care Note  Patient: Joel [name]  Procedure(s) Performed: Procedure(s) (LRB): OPEN REDUCTION INTERNAL FIXATION (ORIF) HAND (Left)  Patient Location: PACU  Anesthesia Type: General  Level of Consciousness: awake and alert   Airway & Oxygen Therapy: Patient Spontanous Breathing  Post-op Assessment: Report given to PACU RN  Post vital signs: Reviewed and stable  Complications: No apparent anesthesia complications

## 2012-05-04 NOTE — Anesthesia Preprocedure Evaluation (Addendum)
Anesthesia Evaluation  Patient identified by MRN, date of birth, ID band Patient awake    Reviewed: Allergy & Precautions, H&P , NPO status , Patient's Chart, lab work & pertinent test results  History of Anesthesia Complications Negative for: history of anesthetic complications  Airway Mallampati: I TM Distance: >3 FB Neck ROM: Full    Dental  (+) Teeth Intact and Dental Advisory Given   Pulmonary Current Smoker (cigars),          Cardiovascular Exercise Tolerance: Good     Neuro/Psych negative neurological ROS  negative psych ROS   GI/Hepatic Neg liver ROS, GERD-  Medicated and Controlled,  Endo/Other  negative endocrine ROS  Renal/GU negative Renal ROS  negative genitourinary   Musculoskeletal negative musculoskeletal ROS (+)   Abdominal   Peds negative pediatric ROS (+)  Hematology negative hematology ROS (+)   Anesthesia Other Findings   Reproductive/Obstetrics                           Anesthesia Physical Anesthesia Plan  ASA: II  Anesthesia Plan:    Post-op Pain Management:    Induction:   Airway Management Planned:   Additional Equipment:   Intra-op Plan:   Post-operative Plan:   Informed Consent:   Plan Discussed with:   Anesthesia Plan Comments:         Anesthesia Quick Evaluation

## 2012-05-04 NOTE — Anesthesia Postprocedure Evaluation (Signed)
  Anesthesia Post-op Note  Patient: Joel [name]  Procedure(s) Performed: Procedure(s) (LRB): OPEN REDUCTION INTERNAL FIXATION (ORIF) HAND (Left)  Patient Location: PACU  Anesthesia Type: General  Level of Consciousness: awake, alert  and oriented  Airway and Oxygen Therapy: Patient Spontanous Breathing  Post-op Pain: mild  Post-op Assessment: Post-op Vital signs reviewed and Patient's Cardiovascular Status Stable  Post-op Vital Signs: stable  Complications: No apparent anesthesia complications

## 2012-05-04 NOTE — Anesthesia Procedure Notes (Signed)
Procedure Name: LMA Insertion Date/Time: 05/04/2012 2:47 PM Performed by: Gwenyth Allegra Pre-anesthesia Checklist: Patient identified, Timeout performed, Emergency Drugs available, Suction available and Patient being monitored Patient Re-evaluated:Patient Re-evaluated prior to inductionOxygen Delivery Method: Circle system utilized Preoxygenation: Pre-oxygenation with 100% oxygen Intubation Type: IV induction LMA: LMA inserted LMA Size: 5.0 Number of attempts: 1 Placement Confirmation: positive ETCO2 and breath sounds checked- equal and bilateral Tube secured with: Tape Dental Injury: Teeth and Oropharynx as per pre-operative assessment

## 2012-08-03 ENCOUNTER — Encounter (HOSPITAL_COMMUNITY): Payer: Self-pay | Admitting: Pharmacy Technician

## 2012-08-07 ENCOUNTER — Other Ambulatory Visit (HOSPITAL_COMMUNITY): Payer: Self-pay | Admitting: Orthopedic Surgery

## 2012-08-10 ENCOUNTER — Ambulatory Visit (HOSPITAL_COMMUNITY): Admission: RE | Admit: 2012-08-10 | Payer: Self-pay | Source: Ambulatory Visit | Admitting: Orthopedic Surgery

## 2012-08-10 ENCOUNTER — Encounter (HOSPITAL_COMMUNITY): Admission: RE | Payer: Self-pay | Source: Ambulatory Visit

## 2012-08-10 SURGERY — AMPUTATION DIGIT
Anesthesia: General | Laterality: Left

## 2012-10-03 ENCOUNTER — Emergency Department (HOSPITAL_BASED_OUTPATIENT_CLINIC_OR_DEPARTMENT_OTHER)
Admission: EM | Admit: 2012-10-03 | Discharge: 2012-10-03 | Disposition: A | Discharge status: Home / Self Care | Payer: Self-pay | Attending: Emergency Medicine | Admitting: Emergency Medicine

## 2012-10-03 ENCOUNTER — Encounter (HOSPITAL_BASED_OUTPATIENT_CLINIC_OR_DEPARTMENT_OTHER): Payer: Self-pay | Admitting: *Deleted

## 2012-10-03 DIAGNOSIS — Z79899 Other long term (current) drug therapy: Secondary | ICD-10-CM | POA: Insufficient documentation

## 2012-10-03 DIAGNOSIS — S0500XA Injury of conjunctiva and corneal abrasion without foreign body, unspecified eye, initial encounter: Secondary | ICD-10-CM

## 2012-10-03 DIAGNOSIS — Z791 Long term (current) use of non-steroidal anti-inflammatories (NSAID): Secondary | ICD-10-CM | POA: Insufficient documentation

## 2012-10-03 DIAGNOSIS — Y9289 Other specified places as the place of occurrence of the external cause: Secondary | ICD-10-CM | POA: Insufficient documentation

## 2012-10-03 DIAGNOSIS — K219 Gastro-esophageal reflux disease without esophagitis: Secondary | ICD-10-CM | POA: Insufficient documentation

## 2012-10-03 DIAGNOSIS — Z87828 Personal history of other (healed) physical injury and trauma: Secondary | ICD-10-CM | POA: Insufficient documentation

## 2012-10-03 DIAGNOSIS — Y9389 Activity, other specified: Secondary | ICD-10-CM | POA: Insufficient documentation

## 2012-10-03 DIAGNOSIS — IMO0002 Reserved for concepts with insufficient information to code with codable children: Secondary | ICD-10-CM | POA: Insufficient documentation

## 2012-10-03 DIAGNOSIS — S058X9A Other injuries of unspecified eye and orbit, initial encounter: Secondary | ICD-10-CM | POA: Insufficient documentation

## 2012-10-03 MED ORDER — TETRACAINE HCL 0.5 % OP SOLN
2.0000 [drp] | Freq: Once | OPHTHALMIC | Status: AC
  Administered 2012-10-03: 2 [drp] via OPHTHALMIC

## 2012-10-03 MED ORDER — FLUORESCEIN SODIUM 1 MG OP STRP
ORAL_STRIP | OPHTHALMIC | Status: AC
  Administered 2012-10-03: 1 via OPHTHALMIC
  Filled 2012-10-03: qty 1

## 2012-10-03 MED ORDER — ERYTHROMYCIN 5 MG/GM OP OINT
TOPICAL_OINTMENT | Freq: Four times a day (QID) | OPHTHALMIC | Status: DC
  Administered 2012-10-03: 1 via OPHTHALMIC

## 2012-10-03 MED ORDER — TETRACAINE HCL 0.5 % OP SOLN
OPHTHALMIC | Status: AC
  Administered 2012-10-03: 2 [drp] via OPHTHALMIC
  Filled 2012-10-03: qty 2

## 2012-10-03 MED ORDER — FLUORESCEIN SODIUM 1 MG OP STRP
1.0000 | ORAL_STRIP | Freq: Once | OPHTHALMIC | Status: AC
  Administered 2012-10-03: 1 via OPHTHALMIC

## 2012-10-03 NOTE — ED Provider Notes (Signed)
History     CSN: [id number]  Arrival date & time 10/03/12  1735   First MD Initiated Contact with Patient 10/03/12 1820      Chief Complaint  Patient presents with  . Eye Injury    (Consider location/radiation/quality/duration/timing/severity/associated sxs/prior treatment) HPI Comments: Pt states that he was in the woods today and be took up and a branch poked his eye  Patient is a 41 y.o. male presenting with eye injury. The history is provided by the patient. No language interpreter was used.  Eye Injury This is a new problem. The current episode started today. The problem occurs constantly. The problem has been unchanged. Nothing aggravates the symptoms. He has tried nothing for the symptoms.    Past Medical History  Diagnosis Date  . Infection of index finger     dog bite  . GERD (gastroesophageal reflux disease)     takes Prilosec bid    Past Surgical History  Procedure Date  . Elbow bursa surgery 2011    Right  . Neck cyst removed     at age 35  . I&d extremity 03/08/2012    Procedure: IRRIGATION AND DEBRIDEMENT EXTREMITY;  Surgeon: Nadara Mustard, MD;  Location: [facility] OR;  Service: Orthopedics;  Laterality: Left;  Left Index Finger Irrigation and Debridement, Place Antibiotic Beads    History reviewed. No pertinent family history.  History  Substance Use Topics  . Smoking status: Passive Smoke Exposure - Never Smoker    Types: Cigars  . Smokeless tobacco: Not on file  . Alcohol Use: No      Review of Systems  Constitutional: Negative.   Respiratory: Negative.   Cardiovascular: Negative.   Neurological: Negative.     Allergies  Review of patient's allergies indicates no known allergies.  Home Medications   Current Outpatient Rx  Name  Route  Sig  Dispense  Refill  . LANSOPRAZOLE 15 MG PO CPDR   Oral   Take 15 mg by mouth daily.         Marland Kitchen NAPROXEN SODIUM 220 MG PO TABS   Oral   Take 440 mg by mouth 2 (two) times daily as needed. For pain           BP 137/76  Pulse 80  Temp 99.1 F (37.3 C)  Resp 16  Ht 5\' 11"  (1.803 m)  Wt 190 lb (86.183 kg)  BMI 26.50 kg/m2  SpO2 100%  Physical Exam  Nursing note and vitals reviewed. Constitutional: He appears well-developed and well-nourished.  HENT:  Head: Normocephalic and atraumatic.  Eyes: Conjunctivae normal and EOM are normal. Pupils are equal, round, and reactive to light.  Slit lamp exam:      The right eye shows corneal abrasion and fluorescein uptake.  Cardiovascular: Normal rate and regular rhythm.   Pulmonary/Chest: Effort normal and breath sounds normal.    ED Course  Procedures (including critical care time)  Labs Reviewed - No data to display No results found.   1. Corneal abrasion       MDM  Pt given antibiotics here to take:tetanus utd:pt is not a contact lense wearer        Teressa Lower, NP 10/03/12 2112

## 2012-10-03 NOTE — ED Notes (Signed)
Pt c/o right eye injury, stick to eye while hunting

## 2012-10-03 NOTE — ED Notes (Signed)
Woods lamp at the bedside. Slit also at the bedside.

## 2012-10-03 NOTE — ED Provider Notes (Signed)
Medical screening examination/treatment/procedure(s) were performed by non-physician practitioner and as supervising physician I was immediately available for consultation/collaboration.  Ciera Beckum, MD 10/03/12 2316 

## 2014-04-03 ENCOUNTER — Encounter (HOSPITAL_BASED_OUTPATIENT_CLINIC_OR_DEPARTMENT_OTHER): Payer: Self-pay | Admitting: Emergency Medicine

## 2014-04-03 ENCOUNTER — Emergency Department (HOSPITAL_BASED_OUTPATIENT_CLINIC_OR_DEPARTMENT_OTHER)
Admission: EM | Admit: 2014-04-03 | Discharge: 2014-04-04 | Disposition: A | Discharge status: Home / Self Care | Payer: Self-pay | Attending: Emergency Medicine | Admitting: Emergency Medicine

## 2014-04-03 ENCOUNTER — Emergency Department (HOSPITAL_BASED_OUTPATIENT_CLINIC_OR_DEPARTMENT_OTHER): Payer: Self-pay

## 2014-04-03 DIAGNOSIS — K219 Gastro-esophageal reflux disease without esophagitis: Secondary | ICD-10-CM | POA: Insufficient documentation

## 2014-04-03 DIAGNOSIS — S59912A Unspecified injury of left forearm, initial encounter: Secondary | ICD-10-CM

## 2014-04-03 DIAGNOSIS — Z791 Long term (current) use of non-steroidal anti-inflammatories (NSAID): Secondary | ICD-10-CM | POA: Insufficient documentation

## 2014-04-03 DIAGNOSIS — Y929 Unspecified place or not applicable: Secondary | ICD-10-CM | POA: Insufficient documentation

## 2014-04-03 DIAGNOSIS — Z79899 Other long term (current) drug therapy: Secondary | ICD-10-CM | POA: Insufficient documentation

## 2014-04-03 DIAGNOSIS — IMO0002 Reserved for concepts with insufficient information to code with codable children: Secondary | ICD-10-CM | POA: Insufficient documentation

## 2014-04-03 DIAGNOSIS — Y9389 Activity, other specified: Secondary | ICD-10-CM | POA: Insufficient documentation

## 2014-04-03 DIAGNOSIS — Z872 Personal history of diseases of the skin and subcutaneous tissue: Secondary | ICD-10-CM | POA: Insufficient documentation

## 2014-04-03 DIAGNOSIS — W268XXA Contact with other sharp object(s), not elsewhere classified, initial encounter: Secondary | ICD-10-CM | POA: Insufficient documentation

## 2014-04-03 DIAGNOSIS — Z23 Encounter for immunization: Secondary | ICD-10-CM | POA: Insufficient documentation

## 2014-04-03 NOTE — ED Notes (Signed)
Pt refused XR at this time.

## 2014-04-03 NOTE — ED Notes (Signed)
Pt. Has noted fish hook in the L forearm.  Pt. In no distress.  Pt.  Reports discomfort.

## 2014-04-04 MED ORDER — TETANUS-DIPHTH-ACELL PERTUSSIS 5-2.5-18.5 LF-MCG/0.5 IM SUSP
INTRAMUSCULAR | Status: AC
  Filled 2014-04-04: qty 0.5

## 2014-04-04 MED ORDER — LIDOCAINE HCL (PF) 1 % IJ SOLN
INTRAMUSCULAR | Status: AC
  Administered 2014-04-04: 5 mL
  Filled 2014-04-04: qty 5

## 2014-04-04 MED ORDER — TETANUS-DIPHTH-ACELL PERTUSSIS 5-2.5-18.5 LF-MCG/0.5 IM SUSP
0.5000 mL | Freq: Once | INTRAMUSCULAR | Status: AC
  Administered 2014-04-04: 0.5 mL via INTRAMUSCULAR

## 2014-04-04 NOTE — Discharge Instructions (Signed)
Return for signs of infection.  If you were given medicines take as directed.  If you are on coumadin or contraceptives realize their levels and effectiveness is altered by many different medicines.  If you have any reaction (rash, tongues swelling, other) to the medicines stop taking and see a physician.   Please follow up as directed and return to the ER or see a physician for new or worsening symptoms.  Thank you. Filed Vitals:   04/03/14 2313  BP: 119/75  Pulse: 87  Temp: 98 F (36.7 C)  TempSrc: Oral  Resp: 18  SpO2: 96%

## 2014-04-04 NOTE — ED Provider Notes (Signed)
CSN: [id number]     Arrival date & time 04/03/14  2305 History   First MD Initiated Contact with Patient 04/03/14 2357     Chief Complaint  Patient presents with  . Foreign Body in Skin     (Consider location/radiation/quality/duration/timing/severity/associated sxs/prior Treatment) HPI Comments: 43 year old male with reflux history presents with fishhook in his left forearm since this evening. Patient tried to cut the lumbar cough however unable to remove the other 2. Pain with palpation. Tetanus unsure. Located left dorsal forearm. Patient did catch plenty of fish today.  The history is provided by the patient.    Past Medical History  Diagnosis Date  . Infection of index finger     dog bite  . GERD (gastroesophageal reflux disease)     takes Prilosec bid   Past Surgical History  Procedure Laterality Date  . Elbow bursa surgery  2011    Right  . Neck cyst removed      at age 43  . I&d extremity  03/08/2012    Procedure: IRRIGATION AND DEBRIDEMENT EXTREMITY;  Surgeon: Nadara MustardMarcus V Duda, MD;  [name];  Service: Orthopedics;  Laterality: Left;  Left Index Finger Irrigation and Debridement, Place Antibiotic Beads   No family history on file. History  Substance Use Topics  . Smoking status: Passive Smoke Exposure - Never Smoker    Types: Cigars  . Smokeless tobacco: Not on file  . Alcohol Use: No    Review of Systems  Constitutional: Negative for fever.  Skin: Positive for wound.  Neurological: Negative for weakness and numbness.      Allergies  Review of patient's allergies indicates no known allergies.  Home Medications   Prior to Admission medications   Medication Sig Start Date End Date Taking? Authorizing Provider  lansoprazole (PREVACID) 15 MG capsule Take 15 mg by mouth daily.    Historical Provider, MD  naproxen sodium (ALEVE) 220 MG tablet Take 440 mg by mouth 2 (two) times daily as needed. For pain    Historical Provider, MD   BP 119/75  Pulse 87   Temp(Src) 98 F (36.7 C) (Oral)  Resp 18  SpO2 96% Physical Exam  Nursing note and vitals reviewed. Constitutional: He appears well-developed and well-nourished. No distress.  HENT:  Head: Normocephalic and atraumatic.  Musculoskeletal: He exhibits tenderness. He exhibits no edema.  Neurological: He is alert.  Skin: Skin is warm.  Patient has fishhook and left mid forearm with 2 barbs stuck beneath epidermis. No discharge or signs of infection. Neurovascular intact distal    ED Course  Procedures (including critical care time) Fishhook removal/foreign body removal Betadine applied to cleanse the area. Lidocaine with epinephrine injected left dorsal forearm surrounding fishhook. Firm pressure applied to the base of fishhook causing 2 barbs to breakthrough skin followed by Wire cutters utilized to cut both barbs off. Then fishhook was reversed back to the skin. Labs Review Labs Reviewed - No data to display  Imaging Review No results found.   EKG Interpretation None      MDM   Final diagnoses:  Fish hook injury of left forearm   Fishhook removed in ER. Procedure above. Tetanus update in ED  Results and differential diagnosis were discussed with the patient/parent/guardian. Close follow up outpatient was discussed, comfortable with the plan.   Filed Vitals:   04/03/14 2313  BP: 119/75  Pulse: 87  Temp: 98 F (36.7 C)  TempSrc: Oral  Resp: 18  SpO2: 96%  Enid Skeens, MD 04/04/14 [phone]

## 2015-08-07 ENCOUNTER — Emergency Department (HOSPITAL_BASED_OUTPATIENT_CLINIC_OR_DEPARTMENT_OTHER)
Admission: EM | Admit: 2015-08-07 | Discharge: 2015-08-07 | Disposition: A | Discharge status: Home / Self Care | Payer: Self-pay | Attending: Emergency Medicine | Admitting: Emergency Medicine

## 2015-08-07 ENCOUNTER — Encounter (HOSPITAL_BASED_OUTPATIENT_CLINIC_OR_DEPARTMENT_OTHER): Payer: Self-pay | Admitting: *Deleted

## 2015-08-07 DIAGNOSIS — Z872 Personal history of diseases of the skin and subcutaneous tissue: Secondary | ICD-10-CM | POA: Insufficient documentation

## 2015-08-07 DIAGNOSIS — M7918 Myalgia, other site: Secondary | ICD-10-CM

## 2015-08-07 DIAGNOSIS — M546 Pain in thoracic spine: Secondary | ICD-10-CM | POA: Insufficient documentation

## 2015-08-07 DIAGNOSIS — K219 Gastro-esophageal reflux disease without esophagitis: Secondary | ICD-10-CM | POA: Insufficient documentation

## 2015-08-07 DIAGNOSIS — M791 Myalgia: Secondary | ICD-10-CM | POA: Insufficient documentation

## 2015-08-07 DIAGNOSIS — R112 Nausea with vomiting, unspecified: Secondary | ICD-10-CM | POA: Insufficient documentation

## 2015-08-07 DIAGNOSIS — Z79899 Other long term (current) drug therapy: Secondary | ICD-10-CM | POA: Insufficient documentation

## 2015-08-07 DIAGNOSIS — Z87442 Personal history of urinary calculi: Secondary | ICD-10-CM | POA: Insufficient documentation

## 2015-08-07 HISTORY — DX: Calculus of kidney: N20.0

## 2015-08-07 LAB — URINALYSIS, ROUTINE W REFLEX MICROSCOPIC
BILIRUBIN URINE: NEGATIVE
Glucose, UA: NEGATIVE mg/dL
HGB URINE DIPSTICK: NEGATIVE
Ketones, ur: NEGATIVE mg/dL
Nitrite: NEGATIVE
Protein, ur: NEGATIVE mg/dL
Specific Gravity, Urine: 1.017 (ref 1.005–1.030)
Urobilinogen, UA: 1 mg/dL (ref 0.0–1.0)
pH: 6 (ref 5.0–8.0)

## 2015-08-07 LAB — URINE MICROSCOPIC-ADD ON

## 2015-08-07 LAB — CBG MONITORING, ED: GLUCOSE-CAPILLARY: 101 mg/dL — AB (ref 65–99)

## 2015-08-07 MED ORDER — OXYCODONE-ACETAMINOPHEN 5-325 MG PO TABS
2.0000 | ORAL_TABLET | Freq: Once | ORAL | Status: AC
Start: 2015-08-07 — End: 2015-08-07
  Administered 2015-08-07: 2 via ORAL
  Filled 2015-08-07: qty 2

## 2015-08-07 MED ORDER — NAPROXEN SODIUM 220 MG PO TABS: 440.0000 mg | ORAL_TABLET | Freq: Two times a day (BID) | ORAL | Status: DC | PRN

## 2015-08-07 NOTE — ED Notes (Signed)
Right flank pain x 2 days. States pain is a squeezing sensation and does not feel like his previous kidney stones.

## 2015-08-07 NOTE — ED Provider Notes (Signed)
CSN: [id number]     Arrival date & time 08/07/15  1652 History   First MD Initiated Contact with Patient 08/07/15 1704     Chief Complaint  Patient presents with  . Flank Pain     (Consider location/radiation/quality/duration/timing/severity/associated sxs/prior Treatment) HPI Joel [name] is a 44 y.o. male who comes in for evaluation of right-sided flank pain. Patient has a history of kidney stones, but reports pain is slightly dissimilar. When he had prior kidney stones he reports the pain wrapped around his entire flank, but now the pain is localized to his right back. He denies any fevers, chills, urinary symptoms. He does report associated nausea and vomiting. Denies any other abdominal pain, diarrhea. Last bowel movement 2 hours PTA and normal for him. Overall discomfort rated as 8/10. Patient also expresses concern for diabetes as he reports is hereditary in his family. Requests blood check.  Past Medical History  Diagnosis Date  . Infection of index finger     dog bite  . GERD (gastroesophageal reflux disease)     takes Prilosec bid  . Kidney stones    Past Surgical History  Procedure Laterality Date  . Elbow bursa surgery  2011    Right  . Neck cyst removed      at age 45  . I&d extremity  03/08/2012    Procedure: IRRIGATION AND DEBRIDEMENT EXTREMITY;  Surgeon: Nadara Mustard, MD;  Location: [facility] OR;  Service: Orthopedics;  Laterality: Left;  Left Index Finger Irrigation and Debridement, Place Antibiotic Beads   No family history on file. Social History  Substance Use Topics  . Smoking status: Passive Smoke Exposure - Never Smoker    Types: Cigars  . Smokeless tobacco: None  . Alcohol Use: No    Review of Systems A 10 point review of systems was completed and was negative except for pertinent positives and negatives as mentioned in the history of present illness     Allergies  Review of patient's allergies indicates no known allergies.  Home Medications    Prior to Admission medications   Medication Sig Start Date End Date Taking? Authorizing Provider  lansoprazole (PREVACID) 15 MG capsule Take 15 mg by mouth daily.    Historical Provider, MD  naproxen sodium (ALEVE) 220 MG tablet Take 2 tablets (440 mg total) by mouth 2 (two) times daily as needed. For pain 08/07/15   Joycie Peek, PA-C   BP 132/78 mmHg  Pulse 78  Temp(Src) 98 F (36.7 C) (Oral)  Resp 15  Ht  (1.803 m)  Wt 210 lb (95.255 kg)  BMI 29.30 kg/m2  SpO2 98% Physical Exam  Constitutional: He is oriented to person, place, and time. He appears well-developed and well-nourished.  HENT:  Head: Normocephalic and atraumatic.  Mouth/Throat: Oropharynx is clear and moist.  Eyes: Conjunctivae are normal. Pupils are equal, round, and reactive to light. Right eye exhibits no discharge. Left eye exhibits no discharge. No scleral icterus.  Neck: Neck supple.  Cardiovascular: Normal rate, regular rhythm and normal heart sounds.   Pulmonary/Chest: Effort normal and breath sounds normal. No respiratory distress. He has no wheezes. He has no rales.  Abdominal: Soft. There is no tenderness.  Abdomen is soft, nondistended. No rebound or guarding. Diffuse tenderness in right flank. No right upper quadrant or right lower quadrant tenderness. no CVA tenderness. No peritoneal signs. No other lesions or deformities.  Musculoskeletal: He exhibits no tenderness.  Tenderness diffusely throughout right paraspinal lumbar and mid back  region. No lesions or deformities noted.  Neurological: He is alert and oriented to person, place, and time.  Cranial Nerves II-XII grossly intact  Skin: Skin is warm and dry. No rash noted.  Psychiatric: He has a normal mood and affect.  Nursing note and vitals reviewed.   ED Course  Procedures (including critical care time) Labs Review Labs Reviewed  URINALYSIS, ROUTINE W REFLEX MICROSCOPIC (NOT AT [facility]) - Abnormal; Notable for the following:     Leukocytes, UA SMALL (*)    All other components within normal limits  CBG MONITORING, ED - Abnormal; Notable for the following:    Glucose-Capillary 101 (*)    All other components within normal limits  URINE MICROSCOPIC-ADD ON    Imaging Review No results found. I have personally reviewed and evaluated these images and lab results as part of my medical decision-making.   EKG Interpretation None     Meds given in ED:  Medications  oxyCODONE-acetaminophen (PERCOCET/ROXICET) 5-325 MG per tablet 2 tablet (2 tablets Oral Given 08/07/15 1750)    Discharge Medication List as of 08/07/2015  6:27 PM     Filed Vitals:   08/07/15 1706 08/07/15 1830  BP: 147/85 132/78  Pulse: 83 78  Temp: 98 F (36.7 C)   TempSrc: Oral   Resp: 18 15  Height:  (1.803 m)   Weight: 210 lb (95.255 kg)   SpO2: 99% 98%    MDM  Vitals stable - WNL -afebrile Pt resting comfortably in ED. PE--patient does have tenderness to palpation in lower thoracic, upper lumbar musculature. No lesions or deformities to suggest zoster. Benign abdominal exam Labwork--no evidence of UTI or renal stone on urinalysis. CBG 101  DDX--patient's symptoms likely due to musculoskeletal strain. Will initiate trial of anti-inflammatory's and supportive care at home. Have patient follow-up with PCP. Low suspicion for other acute intra-abdominal pathology such as cholecystitis, appendicitis. However, patient given strict return precautions and verbalizes understanding. Will DC with naproxen. I discussed all relevant lab findings and imaging results with pt and they verbalized understanding. Discussed f/u with PCP within 48 hrs and return precautions, pt very amenable to plan.  Final diagnoses:  Musculoskeletal pain        Joycie Peek, PA-C 08/08/15 1202  Gwyneth Sprout, MD 08/12/15 2023

## 2015-08-07 NOTE — Discharge Instructions (Signed)
You were evaluated in the ED today for your right-sided back pain and it does not appear to be an emergent cause for your symptoms at this time. Your exam was reassuring. Urinalysis did not show any evidence of any infections or a kidney stone. Please take medications as prescribed. Follow-up with your doctor in 1 week for reevaluation. Return to ED for worsening symptoms  Musculoskeletal Pain Musculoskeletal pain is muscle and boney aches and pains. These pains can occur in any part of the body. Your caregiver may treat you without knowing the cause of the pain. They may treat you if blood or urine tests, X-rays, and other tests were normal.  CAUSES There is often not a definite cause or reason for these pains. These pains may be caused by a type of germ (virus). The discomfort may also come from overuse. Overuse includes working out too hard when your body is not fit. Boney aches also come from weather changes. Bone is sensitive to atmospheric pressure changes. HOME CARE INSTRUCTIONS   Ask when your test results will be ready. Make sure you get your test results.  Only take over-the-counter or prescription medicines for pain, discomfort, or fever as directed by your caregiver. If you were given medications for your condition, do not drive, operate machinery or power tools, or sign legal documents for 24 hours. Do not drink alcohol. Do not take sleeping pills or other medications that may interfere with treatment.  Continue all activities unless the activities cause more pain. When the pain lessens, slowly resume normal activities. Gradually increase the intensity and duration of the activities or exercise.  During periods of severe pain, bed rest may be helpful. Lay or sit in any position that is comfortable.  Putting ice on the injured area.  Put ice in a bag.  Place a towel between your skin and the bag.  Leave the ice on for 15 to 20 minutes, 3 to 4 times a day.  Follow up with your  caregiver for continued problems and no reason can be found for the pain. If the pain becomes worse or does not go away, it may be necessary to repeat tests or do additional testing. Your caregiver may need to look further for a possible cause. SEEK IMMEDIATE MEDICAL CARE IF:  You have pain that is getting worse and is not relieved by medications.  You develop chest pain that is associated with shortness or breath, sweating, feeling sick to your stomach (nauseous), or throw up (vomit).  Your pain becomes localized to the abdomen.  You develop any new symptoms that seem different or that concern you. MAKE SURE YOU:   Understand these instructions.  Will watch your condition.  Will get help right away if you are not doing well or get worse.   This information is not intended to replace advice given to you by your health care provider. Make sure you discuss any questions you have with your health care provider.   Document Released: 10/17/2005 Document Revised: 01/09/2012 Document Reviewed: 06/21/2013 Elsevier Interactive Patient Education [employer]

## 2016-11-04 ENCOUNTER — Emergency Department (HOSPITAL_COMMUNITY): Payer: Self-pay

## 2016-11-04 ENCOUNTER — Emergency Department (HOSPITAL_COMMUNITY)
Admission: EM | Admit: 2016-11-04 | Discharge: 2016-11-04 | Disposition: A | Discharge status: Home / Self Care | Payer: Self-pay | Attending: Emergency Medicine | Admitting: Emergency Medicine

## 2016-11-04 ENCOUNTER — Encounter (HOSPITAL_COMMUNITY): Payer: Self-pay | Admitting: Emergency Medicine

## 2016-11-04 DIAGNOSIS — Y990 Civilian activity done for income or pay: Secondary | ICD-10-CM | POA: Insufficient documentation

## 2016-11-04 DIAGNOSIS — Z7722 Contact with and (suspected) exposure to environmental tobacco smoke (acute) (chronic): Secondary | ICD-10-CM | POA: Insufficient documentation

## 2016-11-04 DIAGNOSIS — Y929 Unspecified place or not applicable: Secondary | ICD-10-CM | POA: Insufficient documentation

## 2016-11-04 DIAGNOSIS — Y9389 Activity, other specified: Secondary | ICD-10-CM | POA: Insufficient documentation

## 2016-11-04 DIAGNOSIS — W208XXA Other cause of strike by thrown, projected or falling object, initial encounter: Secondary | ICD-10-CM | POA: Insufficient documentation

## 2016-11-04 DIAGNOSIS — S9031XA Contusion of right foot, initial encounter: Secondary | ICD-10-CM | POA: Insufficient documentation

## 2016-11-04 MED ORDER — IBUPROFEN 600 MG PO TABS: 600.0000 mg | ORAL_TABLET | Freq: Three times a day (TID) | ORAL | 0 refills | Status: DC | PRN

## 2016-11-04 MED ORDER — OXYCODONE-ACETAMINOPHEN 5-325 MG PO TABS: 1.0000 | ORAL_TABLET | ORAL | 0 refills | Status: DC | PRN

## 2016-11-04 MED ORDER — IBUPROFEN 800 MG PO TABS
800.0000 mg | ORAL_TABLET | Freq: Once | ORAL | Status: AC
  Administered 2016-11-04: 800 mg via ORAL
  Filled 2016-11-04: qty 1

## 2016-11-04 MED ORDER — OXYCODONE-ACETAMINOPHEN 5-325 MG PO TABS
1.0000 | ORAL_TABLET | Freq: Once | ORAL | Status: AC
  Administered 2016-11-04: 1 via ORAL
  Filled 2016-11-04: qty 1

## 2016-11-04 NOTE — ED Provider Notes (Signed)
[facility]-EMERGENCY DEPT Provider Note   CSN: [id number] Arrival date & time: 11/04/16  1341     History   Chief Complaint Chief Complaint  Patient presents with  . Foot Injury    HPI Joel [name] is a 46 y.o. male and past medical history presenting for evaluation of pain of his dorsal left foot which has been persistent since he dropped a 4 pound brass plumbing part on the foot at work.  Since this event he has had difficulty with weightbearing although is able to with increased discomfort radiating to his ankle.  He was wearing work boots during the event.  He has had no treatment prior to arrival.    The history is provided by the patient.    Past Medical History:  Diagnosis Date  . GERD (gastroesophageal reflux disease)    takes Prilosec bid  . Infection of index finger    dog bite  . Kidney stones     There are no active problems to display for this patient.   Past Surgical History:  Procedure Laterality Date  . ELBOW BURSA SURGERY  2011   Right  . I&D EXTREMITY  03/08/2012   Procedure: IRRIGATION AND DEBRIDEMENT EXTREMITY;  Surgeon: Nadara Mustard, MD;  Location: [facility] OR;  Service: Orthopedics;  Laterality: Left;  Left Index Finger Irrigation and Debridement, Place Antibiotic Beads  . neck cyst removed     at age 36       Home Medications    Prior to Admission medications   Medication Sig Start Date End Date Taking? Authorizing Provider  ibuprofen (ADVIL,MOTRIN) 600 MG tablet Take 1 tablet (600 mg total) by mouth every 8 (eight) hours as needed for moderate pain. 11/04/16   Burgess Amor, PA-C  lansoprazole (PREVACID) 15 MG capsule Take 15 mg by mouth daily.    Historical Provider, MD  naproxen sodium (ALEVE) 220 MG tablet Take 2 tablets (440 mg total) by mouth 2 (two) times daily as needed. For pain 08/07/15   Joycie Peek, PA-C  oxyCODONE-acetaminophen (PERCOCET/ROXICET) 5-325 MG tablet Take 1 tablet by mouth every 4 (four) hours as needed. 11/04/16   Burgess Amor, PA-C    Family History No family history on file.  Social History Social History  Substance Use Topics  . Smoking status: Passive Smoke Exposure - Never Smoker    Types: Cigars  . Smokeless tobacco: Never Used  . Alcohol use No     Allergies   Patient has no known allergies.   Review of Systems Review of Systems  Constitutional: Negative for fever.  Musculoskeletal: Positive for arthralgias. Negative for joint swelling and myalgias.  Neurological: Negative for weakness and numbness.     Physical Exam Updated Vital Signs BP 150/84 (BP Location: Right Arm)   Pulse 91   Temp 98.9 F (37.2 C) (Oral)   Resp 20   Ht 5\' 11"  (1.803 m)   Wt 93 kg   SpO2 98%   BMI 28.59 kg/m   Physical Exam  Constitutional: He appears well-developed and well-nourished.  HENT:  Head: Atraumatic.  Neck: Normal range of motion.  Cardiovascular:  Pulses equal bilaterally  Musculoskeletal: He exhibits tenderness. He exhibits no deformity.       Left foot: There is bony tenderness and swelling. There is normal capillary refill, no crepitus and no deformity.       Feet:  Tender to palpation left dorsal first metatarsal bone.  There is no palpable deformity.  Mild edema with  early bruising noted.  Dorsalis pedis pulses full.  Distal sensation is normal.  Patient has increased pain with ankle flexion and toe flexion.  No injuries noted to ankle or lower leg.  Compartments soft.  Neurological: He is alert. He has normal strength. He displays normal reflexes. No sensory deficit.  Skin: Skin is warm and dry.  Psychiatric: He has a normal mood and affect.     ED Treatments / Results  Labs (all labs ordered are listed, but only abnormal results are displayed) Labs Reviewed - No data to display  EKG  EKG Interpretation None       Radiology Dg Foot Complete Right  Result Date: 11/04/2016 CLINICAL DATA:  Patient dropped metal object fell onto foot with pain and swelling EXAM: RIGHT  FOOT COMPLETE - 3+ VIEW COMPARISON:  None. FINDINGS: Frontal, oblique, and lateral views were obtained. There is no demonstrable fracture or dislocation. There is moderate narrowing of the first MTP joint. Other joint spaces appear normal. No erosive change. IMPRESSION: Osteoarthritic change first MTP joint.  No fracture or dislocation. Electronically Signed   By: Bretta BangWilliam  Woodruff III M.D.   On: 11/04/2016 14:16    Procedures Procedures (including critical care time)  Medications Ordered in ED Medications  oxyCODONE-acetaminophen (PERCOCET/ROXICET) 5-325 MG per tablet 1 tablet (1 tablet Oral Given 11/04/16 1455)  ibuprofen (ADVIL,MOTRIN) tablet 800 mg (800 mg Oral Given 11/04/16 1455)     Initial Impression / Assessment and Plan / ED Course  I have reviewed the triage vital signs and the nursing notes.  Pertinent labs & imaging results that were available during my care of the patient were reviewed by me and considered in my medical decision making (see chart for details).  Clinical Course     RICE, oxycodone, crutches, post op shoe given.  Advised prn f/u with ortho or employers physician of choice (work related injury).  Referral given prn if sx persist or are not greatly improving within 1 week.  Final Clinical Impressions(s) / ED Diagnoses   Final diagnoses:  Contusion of right foot, initial encounter    New Prescriptions New Prescriptions   IBUPROFEN (ADVIL,MOTRIN) 600 MG TABLET    Take 1 tablet (600 mg total) by mouth every 8 (eight) hours as needed for moderate pain.   OXYCODONE-ACETAMINOPHEN (PERCOCET/ROXICET) 5-325 MG TABLET    Take 1 tablet by mouth every 4 (four) hours as needed.     Burgess AmorJulie Genette Huertas, PA-C 11/04/16 1502    Marily MemosJason Mesner, MD 11/04/16 [phone]

## 2016-11-04 NOTE — ED Notes (Signed)
Patient given discharge instruction, verbalized understand. Patient ambulatory out of the department.  

## 2016-11-04 NOTE — ED Triage Notes (Signed)
Drop heavy metal object on top of foot around 2 hours ago causing swelling and pain

## 2016-11-04 NOTE — ED Notes (Signed)
Gone for xray

## 2016-11-04 NOTE — ED Notes (Signed)
Raynelle FanningJulie at the bedside to reassess and discuss xrays

## 2016-11-04 NOTE — Discharge Instructions (Signed)
Continue using ice and elevate her foot as much as possible over the next several days.  Wear the postop shoe given if it helps relieve your pain. Use the crutches to minimize weight bearing until your symptoms improve.  You may take the oxycodone prescribed for pain relief.  This will make you drowsy - do not drive within 4 hours of taking this medication.

## 2018-06-08 ENCOUNTER — Ambulatory Visit (HOSPITAL_COMMUNITY)
Admission: EM | Admit: 2018-06-08 | Discharge: 2018-06-08 | Disposition: A | Discharge status: Home / Self Care | Payer: BLUE CROSS/BLUE SHIELD | Attending: Family Medicine | Admitting: Family Medicine

## 2018-06-08 ENCOUNTER — Encounter (HOSPITAL_COMMUNITY): Payer: Self-pay

## 2018-06-08 DIAGNOSIS — W260XXA Contact with knife, initial encounter: Secondary | ICD-10-CM

## 2018-06-08 DIAGNOSIS — S61217A Laceration without foreign body of left little finger without damage to nail, initial encounter: Secondary | ICD-10-CM

## 2018-06-08 MED ORDER — LIDOCAINE HCL (PF) 2 % IJ SOLN
INTRAMUSCULAR | Status: AC
  Filled 2018-06-08: qty 2

## 2018-06-08 MED ORDER — CEPHALEXIN 500 MG PO CAPS: 500.0000 mg | ORAL_CAPSULE | Freq: Two times a day (BID) | ORAL | 0 refills | Status: DC
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
## 2018-06-08 NOTE — Discharge Instructions (Addendum)
Keep the stitches clean and dry Need to come out in 7 days Take antibiotic twice a day to prevent infection Return if there is any redness, increased pain, or drainage

## 2018-06-08 NOTE — ED Triage Notes (Signed)
Pt presents with laceration of left pinky

## 2022-02-24 ENCOUNTER — Ambulatory Visit: Payer: BLUE CROSS/BLUE SHIELD | Admitting: Family Medicine

## 2022-03-01 ENCOUNTER — Encounter: Payer: Self-pay | Admitting: Family Medicine

## 2022-03-01 ENCOUNTER — Ambulatory Visit (INDEPENDENT_AMBULATORY_CARE_PROVIDER_SITE_OTHER): Payer: BC Managed Care – PPO | Admitting: Family Medicine

## 2022-03-01 VITALS — BP 146/78 | HR 74 | Temp 97.5°F | Ht 70.0 in | Wt 233.2 lb

## 2022-03-01 DIAGNOSIS — Z7689 Persons encountering health services in other specified circumstances: Secondary | ICD-10-CM

## 2022-03-01 DIAGNOSIS — M5136 Other intervertebral disc degeneration, lumbar region: Secondary | ICD-10-CM | POA: Diagnosis not present

## 2022-03-01 DIAGNOSIS — M5441 Lumbago with sciatica, right side: Secondary | ICD-10-CM | POA: Diagnosis not present

## 2022-03-01 DIAGNOSIS — I10 Essential (primary) hypertension: Secondary | ICD-10-CM | POA: Diagnosis not present

## 2022-03-01 DIAGNOSIS — G8929 Other chronic pain: Secondary | ICD-10-CM

## 2022-03-01 DIAGNOSIS — Z720 Tobacco use: Secondary | ICD-10-CM

## 2022-03-01 DIAGNOSIS — Z125 Encounter for screening for malignant neoplasm of prostate: Secondary | ICD-10-CM

## 2022-03-01 DIAGNOSIS — F419 Anxiety disorder, unspecified: Secondary | ICD-10-CM

## 2022-03-01 DIAGNOSIS — M25572 Pain in left ankle and joints of left foot: Secondary | ICD-10-CM

## 2022-03-01 DIAGNOSIS — E669 Obesity, unspecified: Secondary | ICD-10-CM

## 2022-03-01 MED ORDER — NAPROXEN 500 MG PO TABS
500.0000 mg | ORAL_TABLET | Freq: Two times a day (BID) | ORAL | 1 refills | Status: DC | PRN
Start: 2022-03-01 — End: 2022-03-30
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
## 2022-03-01 NOTE — Assessment & Plan Note (Signed)
Discussed lifestyle modifications including low-carb, low-fat diet, increasing fruits and vegetables and exercising ?

## 2022-03-01 NOTE — Assessment & Plan Note (Signed)
GAD-7 elevated at 13 ?PHQ-9 was benign, no SI or HI ?Patient uses distress for intervention such as deep breathing is typically for the problem to deal with distress ?Follow-up in the next month ?

## 2022-03-01 NOTE — Assessment & Plan Note (Signed)
Elevated today ?Recheck in 1 month ?
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
## 2022-03-01 NOTE — Assessment & Plan Note (Signed)
Recommend cessation ?Follow-up in 1 month ?

## 2022-03-01 NOTE — Assessment & Plan Note (Signed)
Tender on exam on the right side ?Denies injury ?Lumbar x-ray ?Naproxen ?Follow-up 1 month ?

## 2022-03-01 NOTE — Patient Instructions (Signed)
Is a pleasure meeting today. ?Lets recheck your blood pressure in 1 month. ?We are checking basic labs today including cholesterol, blood sugar, kidney function. ?For back pain we are starting naproxen and getting a back x-ray.  We will call you with these results. ?

## 2022-03-02 LAB — COMPREHENSIVE METABOLIC PANEL
ALT: 30 U/L (ref 0–53)
AST: 22 U/L (ref 0–37)
Albumin: 4.1 g/dL (ref 3.5–5.2)
Alkaline Phosphatase: 43 U/L (ref 39–117)
BUN: 14 mg/dL (ref 6–23)
CO2: 25 mEq/L (ref 19–32)
Calcium: 9 mg/dL (ref 8.4–10.5)
Chloride: 105 mEq/L (ref 96–112)
Creatinine, Ser: 0.94 mg/dL (ref 0.40–1.50)
GFR: 93.66 mL/min (ref >60.00)
Glucose, Bld: 109 mg/dL — ABNORMAL HIGH (ref 70–99)
Potassium: 4.1 mEq/L (ref 3.5–5.1)
Sodium: 140 mEq/L (ref 135–145)
Total Bilirubin: 0.4 mg/dL (ref 0.2–1.2)
Total Protein: 6.2 g/dL (ref 6.0–8.3)

## 2022-03-02 LAB — LIPID PANEL
Cholesterol: 184 mg/dL (ref 0–200)
HDL: 33.8 mg/dL — ABNORMAL LOW (ref >39.00)
Total CHOL/HDL Ratio: 5
Triglycerides: 544 mg/dL — ABNORMAL HIGH (ref 0.0–149.0)

## 2022-03-02 LAB — HEMOGLOBIN A1C: Hgb A1c MFr Bld: 6.1 % (ref 4.6–6.5)

## 2022-03-02 LAB — LDL CHOLESTEROL, DIRECT: Direct LDL: 110 mg/dL

## 2022-03-02 LAB — PSA: PSA: 1.04 ng/mL (ref 0.10–4.00)

## 2022-03-29 ENCOUNTER — Ambulatory Visit: Payer: BC Managed Care – PPO | Admitting: Family Medicine

## 2022-03-30 ENCOUNTER — Ambulatory Visit (INDEPENDENT_AMBULATORY_CARE_PROVIDER_SITE_OTHER): Payer: BC Managed Care – PPO

## 2022-03-30 ENCOUNTER — Ambulatory Visit (INDEPENDENT_AMBULATORY_CARE_PROVIDER_SITE_OTHER): Payer: BC Managed Care – PPO | Admitting: Family Medicine

## 2022-03-30 ENCOUNTER — Encounter: Payer: Self-pay | Admitting: Family Medicine

## 2022-03-30 VITALS — BP 157/86 | HR 87 | Temp 96.2°F | Ht 70.0 in | Wt 231.6 lb

## 2022-03-30 DIAGNOSIS — G8929 Other chronic pain: Secondary | ICD-10-CM

## 2022-03-30 DIAGNOSIS — M5441 Lumbago with sciatica, right side: Secondary | ICD-10-CM | POA: Diagnosis not present

## 2022-03-30 MED ORDER — KETOROLAC TROMETHAMINE 30 MG/ML IJ SOLN: 30.0000 mg | Freq: Once | INTRAMUSCULAR | 0 refills | Status: DC

## 2022-03-30 MED ORDER — KETOROLAC TROMETHAMINE 60 MG/2ML IM SOLN
30.0000 mg | Freq: Once | INTRAMUSCULAR | Status: AC
  Administered 2022-03-30: 30 mg via INTRAMUSCULAR

## 2022-03-30 MED ORDER — METHOCARBAMOL 500 MG PO TABS: 500.0000 mg | ORAL_TABLET | Freq: Four times a day (QID) | ORAL | 0 refills | Status: DC

## 2022-03-30 MED ORDER — PREDNISONE 20 MG PO TABS: 40.0000 mg | ORAL_TABLET | Freq: Every day | ORAL | 0 refills | Status: AC

## 2022-03-30 MED ORDER — DICLOFENAC SODIUM 75 MG PO TBEC: 75.0000 mg | DELAYED_RELEASE_TABLET | Freq: Two times a day (BID) | ORAL | 0 refills | Status: DC

## 2022-03-30 NOTE — Progress Notes (Signed)
   Joel [name] is a 51 y.o. male who presents today for an office visit.  Assessment/Plan:    Chronic Problems Addressed Today: Chronic right-sided low back pain with right-sided sciatica Worsening Toradol given today Trial prednisone 40 mg daily for 5 days DC naproxen Start diclofenac 75 mg twice daily Start Robaxin as ordered Lumbar x-ray ordered Low threshold to get MRI versus referral orthopedics     Subjective:  HPI:  Patient presents for ongoing right-sided lower back pain.  It has been worsening since last visit.  Patient says that he tried the naproxen and it does help some, but he is having worsening ability to work during the day.  He says that the pain now radiates down the right side of his leg and into the groin.  Denies any saddle anesthesia, difficulty urinating or defecating.  Pain is worse when bending over.  Patient has tried muscle relaxers in the past.   Objective:  Physical Exam: BP (!) 157/86 (BP Location: Right Arm, Patient Position: Sitting, Cuff Size: Large)   Pulse 87   Temp (!) 96.2 F (35.7 C) (Temporal)   Ht 5\' 10"  (1.778 m)   Wt 231 lb 9.6 oz (105.1 kg)   SpO2 96%   BMI 33.23 kg/m   Gen: No acute distress, but is very uncomfortable due to pain CV: Regular rate and rhythm with no murmurs appreciated Pulm: Normal work of breathing, clear to auscultation bilaterally with no crackles, wheezes, or rhonchi MSK: Positive straight leg raise on right, after doing this patient did have significant pain and became diaphoretic Neuro: Grossly normal, moves all extremities Psych: Normal affect and thought content       03/30/2022 5:26 PM

## 2022-03-30 NOTE — Patient Instructions (Signed)
We are getting an x-ray today. Event Toradol today to help with pain. Take prednisone for 5 days.  After this you may start the diclofenac.  Do not take any over-the-counter ibuprofen or Aleve and stop taking the naproxen at home. Take all these medicines with food as this can be irritating to the stomach.  If you have worsening heartburn you may take 2 of the Aciphex.

## 2022-03-30 NOTE — Assessment & Plan Note (Signed)
Worsening Toradol given today Trial prednisone 40 mg daily for 5 days DC naproxen Start diclofenac 75 mg twice daily Start Robaxin as ordered Lumbar x-ray ordered Low threshold to get MRI versus referral orthopedics

## 2022-04-26 ENCOUNTER — Encounter: Payer: Self-pay | Admitting: Family Medicine

## 2022-04-26 ENCOUNTER — Ambulatory Visit (INDEPENDENT_AMBULATORY_CARE_PROVIDER_SITE_OTHER): Payer: BC Managed Care – PPO | Admitting: Family Medicine

## 2022-04-26 VITALS — BP 142/86 | HR 76 | Temp 97.4°F | Wt 235.2 lb

## 2022-04-26 DIAGNOSIS — G8929 Other chronic pain: Secondary | ICD-10-CM

## 2022-04-26 DIAGNOSIS — M5441 Lumbago with sciatica, right side: Secondary | ICD-10-CM

## 2022-04-26 MED ORDER — CYCLOBENZAPRINE HCL 10 MG PO TABS: 10.0000 mg | ORAL_TABLET | Freq: Two times a day (BID) | ORAL | 1 refills | Status: AC | PRN

## 2022-04-26 MED ORDER — LIDOCAINE 4 % EX PTCH: 1.0000 | MEDICATED_PATCH | CUTANEOUS | 0 refills | Status: AC

## 2022-05-10 ENCOUNTER — Ambulatory Visit (INDEPENDENT_AMBULATORY_CARE_PROVIDER_SITE_OTHER): Payer: BC Managed Care – PPO | Admitting: Physical Medicine and Rehabilitation

## 2022-05-10 ENCOUNTER — Encounter: Payer: Self-pay | Admitting: Physical Medicine and Rehabilitation

## 2022-05-10 VITALS — BP 142/82 | HR 99

## 2022-05-10 DIAGNOSIS — M4726 Other spondylosis with radiculopathy, lumbar region: Secondary | ICD-10-CM

## 2022-05-10 DIAGNOSIS — M47816 Spondylosis without myelopathy or radiculopathy, lumbar region: Secondary | ICD-10-CM

## 2022-05-10 DIAGNOSIS — R1031 Right lower quadrant pain: Secondary | ICD-10-CM

## 2022-05-10 DIAGNOSIS — M5416 Radiculopathy, lumbar region: Secondary | ICD-10-CM | POA: Diagnosis not present

## 2022-05-10 DIAGNOSIS — R1032 Left lower quadrant pain: Secondary | ICD-10-CM

## 2022-05-10 MED ORDER — METHOCARBAMOL 500 MG PO TABS: 500.0000 mg | ORAL_TABLET | Freq: Three times a day (TID) | ORAL | 0 refills | Status: DC

## 2022-05-10 NOTE — Progress Notes (Unsigned)
Pt state lower back pain that travels to his right leg. Pt state sitting and walking makes the pain worse. Pt state he rest and takes pain meds to help ease his pain.  Numeric Pain Rating Scale and Functional Assessment Average Pain 10 Pain Right Now 8 My pain is constant, sharp, burning, stabbing, tingling, and aching Pain is worse with: walking, sitting, and some activites Pain improves with: rest, heat/ice, and medication   In the last MONTH (on 0-10 scale) has pain interfered with the following?  1. General activity like being  able to carry out your everyday physical activities such as walking, climbing stairs, carrying groceries, or moving a chair?  Rating(5)  2. Relation with others like being able to carry out your usual social activities and roles such as  activities at home, at work and in your community. Rating(6)  3. Enjoyment of life such that you have  been bothered by emotional problems such as feeling anxious, depressed or irritable?  Rating(7)

## 2022-05-10 NOTE — Progress Notes (Unsigned)
Joel [name] - 50 y.o. male MRN [id number]  Date of birth: 16-Aug-1971  Office Visit Note: Visit Date: 05/10/2022 PCP: Garnette Gunner, MD Referred by: Garnette Gunner, MD  Subjective: Chief Complaint  Patient presents with   Lower Back - Pain   Right Leg - Pain   HPI: Joel [name] is a 51 y.o. male who comes in today per the request of Dr. Fanny Bien for evaluation of chronic, worsening and severe right sided lower back pain radiating to buttock and down lateral thigh to knee. Patient also reports intermittent bilateral groin pain. Patient reports pain has been ongoing for over 2 years and has recently increased over the last couple of months. He states his pain is exacerbated by movement and activity, describes as dull and shooting, currently rates as 9 out of 10. Patient reports bilateral groin pain is exacerbated by walking up stairs and getting in and out of truck everyday. Patient reports some relief of pain with home exercise regimen, epsom salt baths, rest and medications. Patient is currently taking Flexeril at night, reports this medication does help him sleep. Patient states he has tried multiple NSAIDs and Prednisone without significant relief of pain. Patients lumbar x-ray images from May exhibit facet spurring at L4-L5 and L5-S1. Patient currently works in [employer], his job require frequent physical labor. He reports difficulty making it through the work day due to severe pain. Patient denies focal weakness, numbness and tingling. Patient denies recent trauma or falls.     Oswestry Disability Index Score 28% 10 to 20 (40%) moderate disability: The patient experiences more pain and difficulty with sitting, lifting and standing. Travel and social life are more difficult, and they may be disabled from work. Personal care, sexual activity and sleeping are not grossly affected, and the patient can usually be managed by conservative means.  Review of Systems   Musculoskeletal:  Positive for back pain.  Neurological:  Negative for tingling, sensory change, focal weakness and weakness.  All other systems reviewed and are negative.  Otherwise per HPI.  Assessment & Plan: Visit Diagnoses:    ICD-10-CM   1. Lumbar radiculopathy  M54.16     2. Other spondylosis with radiculopathy, lumbar region  M47.26     3. Facet arthropathy, lumbar  M47.816     4. Bilateral groin pain  R10.31    R10.32        Plan: Findings:  Chronic, worsening and severe right sided lower back pain radiating to buttock and down lateral thigh to knee, also intermittent pain to bilateral groin regions. Patient continues to have severe pain despite good conservative therapies such as home exercise regimen, hot baths, rest and use of medications. Patients clinical presentation are consistent with L5 nerve pattern, however bilateral groin pain could be hip related. We believe the next step is to perform a diagnostic and hopefully therapeutic right L5-S1 interlaminar epidural steroid injection under fluoroscopic guidance. If his pain persists post injection we did discuss possibility of obtaining lumbar MRI imaging and/or images of bilateral hips. I did place prescription for Robaxin today, instructed patient to try this medication at night for several days before taking it during the day while at work. I did explain lumbar injection procedure with patient in detail today, he has no questions at this time. No red flag symptoms noted upon exam today.    Dr. Alvester Morin participated with direct patient care including clinical review, exam when needed and significant portion of diagnostic and  treatment plan.  I participated today with the patient's evaluation and treatment plan along with Ellin Goodie, FNP  Meds & Orders: No orders of the defined types were placed in this encounter.  No orders of the defined types were placed in this encounter.   Follow-up: Return for Right L5-S1  interlaminar epidural steroid injection.   Procedures: No procedures performed      Clinical History: EXAM: LUMBAR SPINE - COMPLETE 4+ VIEW   COMPARISON:  None Available.   FINDINGS: Diffusely preserved disc height. No significant endplate spurring. Borderline facet spurring at L4-5 and L5-S1. No evidence of fracture or bone lesion.   IMPRESSION: Possible mild lower facet spurring.  No acute or focal finding.     Electronically Signed   By: Tiburcio Pea M.D.   On: 04/01/2022 10:31   He reports that he has been smoking cigarettes. He has a 60.00 pack-year smoking history. He has been exposed to tobacco smoke. He has quit using smokeless tobacco.  His smokeless tobacco use included chew.  Recent Labs    03/01/22 1633  HGBA1C 6.1    Objective:  VS:  HT:    WT:   BMI:     BP: (!) 142/82  HR:99bpm  TEMP: ( )  RESP:  Physical Exam Vitals and nursing note reviewed.  HENT:     Head: Normocephalic and atraumatic.     Right Ear: External ear normal.     Left Ear: External ear normal.     Nose: Nose normal.     Mouth/Throat:     Mouth: Mucous membranes are moist.  Eyes:     Extraocular Movements: Extraocular movements intact.  Cardiovascular:     Rate and Rhythm: Normal rate.     Pulses: Normal pulses.  Pulmonary:     Effort: Pulmonary effort is normal.  Abdominal:     General: Abdomen is flat. There is no distension.  Musculoskeletal:        General: Tenderness present.     Cervical back: Normal range of motion.     Comments: Pt rises from seated position to standing without difficulty. Good lumbar range of motion. Strong distal strength without clonus, no pain upon palpation of greater trochanters. Dysesthesias noted to right L5 dermatome. Sensation intact bilaterally. Walks independently, gait steady.   Skin:    General: Skin is warm and dry.     Capillary Refill: Capillary refill takes less than 2 seconds.  Neurological:     General: No focal deficit  present.     Mental Status: He is alert and oriented to person, place, and time.  Psychiatric:        Mood and Affect: Mood normal.        Behavior: Behavior normal.     Ortho Exam  Imaging: No results found.  Past Medical/Family/Surgical/Social History: Medications & Allergies reviewed per EMR, new medications updated. Patient Active Problem List   Diagnosis Date Noted   Hypertension 03/01/2022   Chronic right-sided low back pain with right-sided sciatica 03/01/2022   Anxiety 03/01/2022   Chronic pain of left ankle 03/01/2022   Tobacco use 03/01/2022   Obesity (BMI 30-39.9) 03/01/2022   Past Medical History:  Diagnosis Date   GERD (gastroesophageal reflux disease)    takes Prilosec bid   Infection of index finger    dog bite   Kidney stones    History reviewed. No pertinent family history. Past Surgical History:  Procedure Laterality Date   ELBOW BURSA SURGERY  2011   Right   I & D EXTREMITY  03/08/2012   Procedure: IRRIGATION AND DEBRIDEMENT EXTREMITY;  Surgeon: Nadara Mustard, MD;  Location: [facility] OR;  Service: Orthopedics;  Laterality: Left;  Left Index Finger Irrigation and Debridement, Place Antibiotic Beads   neck cyst removed     at age 65   Social History   Occupational History   Not on file  Tobacco Use   Smoking status: Every Day    Packs/day: 10.00    Years: 6.00    Total pack years: 60.00    Types: Cigarettes    Passive exposure: Yes   Smokeless tobacco: Former    Types: Associate Professor Use: Never used  Substance and Sexual Activity   Alcohol use: No   Drug use: No   Sexual activity: Yes

## 2022-05-17 ENCOUNTER — Ambulatory Visit (INDEPENDENT_AMBULATORY_CARE_PROVIDER_SITE_OTHER): Payer: BC Managed Care – PPO | Admitting: Physical Medicine and Rehabilitation

## 2022-05-17 ENCOUNTER — Ambulatory Visit: Payer: Self-pay

## 2022-05-17 ENCOUNTER — Encounter: Payer: Self-pay | Admitting: Physical Medicine and Rehabilitation

## 2022-05-17 VITALS — BP 143/77 | HR 83

## 2022-05-17 DIAGNOSIS — M5416 Radiculopathy, lumbar region: Secondary | ICD-10-CM | POA: Diagnosis not present

## 2022-05-17 MED ORDER — METHYLPREDNISOLONE ACETATE 80 MG/ML IJ SUSP
80.0000 mg | Freq: Once | INTRAMUSCULAR | Status: AC
  Administered 2022-05-17: 80 mg

## 2022-05-17 NOTE — Progress Notes (Signed)
Pt state lower back pain that travels to his right leg. Pt state sitting and walking makes the pain worse. Pt state he rest and takes pain meds to help ease his pain.  Numeric Pain Rating Scale and Functional Assessment Average Pain 9   In the last MONTH (on 0-10 scale) has pain interfered with the following?  1. General activity like being  able to carry out your everyday physical activities such as walking, climbing stairs, carrying groceries, or moving a chair?  Rating(10)   +Driver, -BT, -Dye Allergies.

## 2022-05-17 NOTE — Patient Instructions (Signed)

## 2022-05-30 NOTE — Procedures (Signed)
Lumbar Epidural Steroid Injection - Interlaminar Approach with Fluoroscopic Guidance  Patient: Joel [name]      Date of Birth: 11-18-1970 MRN: [id number] PCP: Garnette Gunner, MD      Visit Date: 05/17/2022   Universal Protocol:     Consent Given By: the patient  Position: PRONE  Additional Comments: Vital signs were monitored before and after the procedure. Patient was prepped and draped in the usual sterile fashion. The correct patient, procedure, and site was verified.   Injection Procedure Details:   Procedure diagnoses: Lumbar radiculopathy [M54.16]   Meds Administered:  Meds ordered this encounter  Medications   methylPREDNISolone acetate (DEPO-MEDROL) injection 80 mg     Laterality: Right  Location/Site:  L5-S1  Needle: 3.5 in., 20 ga. Tuohy  Needle Placement: Paramedian epidural  Findings:   -Comments: Excellent flow of contrast into the epidural space.  Procedure Details: Using a paramedian approach from the side mentioned above, the region overlying the inferior lamina was localized under fluoroscopic visualization and the soft tissues overlying this structure were infiltrated with 4 ml. of 1% Lidocaine without Epinephrine. The Tuohy needle was inserted into the epidural space using a paramedian approach.   The epidural space was localized using loss of resistance along with counter oblique bi-planar fluoroscopic views.  After negative aspirate for air, blood, and CSF, a 2 ml. volume of Isovue-250 was injected into the epidural space and the flow of contrast was observed. Radiographs were obtained for documentation purposes.    The injectate was administered into the level noted above.   Additional Comments:  The patient tolerated the procedure well Dressing: 2 x 2 sterile gauze and Band-Aid    Post-procedure details: Patient was observed during the procedure. Post-procedure instructions were reviewed.  Patient left the clinic in stable  condition.

## 2022-05-30 NOTE — Progress Notes (Signed)
Joel [name] - 51 y.o. male MRN [id number]  Date of birth: [birth year]  Office Visit Note: Visit Date: 05/17/2022 PCP: Garnette Gunner, MD Referred by: Garnette Gunner, MD  Subjective: Chief Complaint  Patient presents with   Lower Back - Pain   Right Leg - Pain   HPI:  Joel [name] is a 51 y.o. male who comes in today at the request of Ellin Goodie, FNP for planned Right L5-S1 Lumbar Interlaminar epidural steroid injection with fluoroscopic guidance.  The patient has failed conservative care including home exercise, medications, time and activity modification.  This injection will be diagnostic and hopefully therapeutic.  Please see requesting physician notes for further details and justification.   ROS Otherwise per HPI.  Assessment & Plan: Visit Diagnoses:    ICD-10-CM   1. Lumbar radiculopathy  M54.16 XR C-ARM NO REPORT    Epidural Steroid injection    methylPREDNISolone acetate (DEPO-MEDROL) injection 80 mg      Plan: No additional findings.   Meds & Orders:  Meds ordered this encounter  Medications   methylPREDNISolone acetate (DEPO-MEDROL) injection 80 mg    Orders Placed This Encounter  Procedures   XR C-ARM NO REPORT   Epidural Steroid injection    Follow-up: Return for visit to requesting provider as needed.   Procedures: No procedures performed  Lumbar Epidural Steroid Injection - Interlaminar Approach with Fluoroscopic Guidance  Patient: Joel [name]      Date of Birth: 1970/12/24 MRN: [id number] PCP: Garnette Gunner, MD      Visit Date: 05/17/2022   Universal Protocol:     Consent Given By: the patient  Position: PRONE  Additional Comments: Vital signs were monitored before and after the procedure. Patient was prepped and draped in the usual sterile fashion. The correct patient, procedure, and site was verified.   Injection Procedure Details:   Procedure diagnoses: Lumbar radiculopathy [M54.16]   Meds  Administered:  Meds ordered this encounter  Medications   methylPREDNISolone acetate (DEPO-MEDROL) injection 80 mg     Laterality: Right  Location/Site:  L5-S1  Needle: 3.5 in., 20 ga. Tuohy  Needle Placement: Paramedian epidural  Findings:   -Comments: Excellent flow of contrast into the epidural space.  Procedure Details: Using a paramedian approach from the side mentioned above, the region overlying the inferior lamina was localized under fluoroscopic visualization and the soft tissues overlying this structure were infiltrated with 4 ml. of 1% Lidocaine without Epinephrine. The Tuohy needle was inserted into the epidural space using a paramedian approach.   The epidural space was localized using loss of resistance along with counter oblique bi-planar fluoroscopic views.  After negative aspirate for air, blood, and CSF, a 2 ml. volume of Isovue-250 was injected into the epidural space and the flow of contrast was observed. Radiographs were obtained for documentation purposes.    The injectate was administered into the level noted above.   Additional Comments:  The patient tolerated the procedure well Dressing: 2 x 2 sterile gauze and Band-Aid    Post-procedure details: Patient was observed during the procedure. Post-procedure instructions were reviewed.  Patient left the clinic in stable condition.   Clinical History: EXAM: LUMBAR SPINE - COMPLETE 4+ VIEW   COMPARISON:  None Available.   FINDINGS: Diffusely preserved disc height. No significant endplate spurring. Borderline facet spurring at L4-5 and L5-S1. No evidence of fracture or bone lesion.   IMPRESSION: Possible mild lower facet spurring.  No acute or focal finding.  Electronically Signed   By: Tiburcio Pea M.D.   On: 04/01/2022 10:31     Objective:  VS:  HT:    WT:   BMI:     BP:(!) 143/77  HR:83bpm  TEMP: ( )  RESP:  Physical Exam Vitals and nursing note reviewed.  Constitutional:       General: He is not in acute distress.    Appearance: Normal appearance. He is not ill-appearing.  HENT:     Head: Normocephalic and atraumatic.     Right Ear: External ear normal.     Left Ear: External ear normal.     Nose: No congestion.  Eyes:     Extraocular Movements: Extraocular movements intact.  Cardiovascular:     Rate and Rhythm: Normal rate.     Pulses: Normal pulses.  Pulmonary:     Effort: Pulmonary effort is normal. No respiratory distress.  Abdominal:     General: There is no distension.     Palpations: Abdomen is soft.  Musculoskeletal:        General: No tenderness or signs of injury.     Cervical back: Neck supple.     Right lower leg: No edema.     Left lower leg: No edema.     Comments: Patient has good distal strength without clonus.  Skin:    Findings: No erythema or rash.  Neurological:     General: No focal deficit present.     Mental Status: He is alert and oriented to person, place, and time.     Sensory: No sensory deficit.     Motor: No weakness or abnormal muscle tone.     Coordination: Coordination normal.  Psychiatric:        Mood and Affect: Mood normal.        Behavior: Behavior normal.      Imaging: No results found.

## 2022-08-29 ENCOUNTER — Ambulatory Visit: Payer: BC Managed Care – PPO | Admitting: Family Medicine

## 2022-08-30 ENCOUNTER — Encounter: Payer: Self-pay | Admitting: Family Medicine

## 2022-08-30 ENCOUNTER — Ambulatory Visit (INDEPENDENT_AMBULATORY_CARE_PROVIDER_SITE_OTHER): Payer: BC Managed Care – PPO | Admitting: Family Medicine

## 2022-08-30 ENCOUNTER — Ambulatory Visit (INDEPENDENT_AMBULATORY_CARE_PROVIDER_SITE_OTHER): Payer: BC Managed Care – PPO

## 2022-08-30 VITALS — BP 138/80 | HR 83 | Temp 97.4°F | Ht 70.0 in | Wt 232.4 lb

## 2022-08-30 DIAGNOSIS — M2391 Unspecified internal derangement of right knee: Secondary | ICD-10-CM

## 2022-08-30 DIAGNOSIS — M1611 Unilateral primary osteoarthritis, right hip: Secondary | ICD-10-CM | POA: Diagnosis not present

## 2022-08-30 MED ORDER — NAPROXEN 500 MG PO TABS: 500.0000 mg | ORAL_TABLET | Freq: Two times a day (BID) | ORAL | 1 refills | Status: DC

## 2022-08-30 NOTE — Progress Notes (Unsigned)
Initial visit for chronic RT hip pain that goes back to childhood. Denies recent injury

## 2022-08-30 NOTE — Progress Notes (Signed)
[facility] [address] [place] [address] Dept: [phone] Dept Fax: [phone]  Office Visit  Subjective:    Patient ID: Joel [name], male    DOB: 1971-04-20, 51 y.o..   MRN: [id number]  Chief Complaint  Patient presents with   Acute Visit    C/o having pain in lower back that is radiating down the RT side. Has been taking methocarbamol.     History of Present Illness:  Patient is in today for ongoing evaluation of right leg pain. He was seen initially in early may. At that time, he was noting more of a lower back pain with radiation down the right leg. Joel [name] failed conservative treatment and was referred to orthopedics. Dr. Ernestina Patches performed an [place] on 7/18. Although Joel [name] feels the back pain is less prominent, he notes his right hip and knee pain have become significantly worse. he now notes that with about every 6th step, it feels like a sharp object is stabbing into his right hip. He recalls that at about age 47 he was diagnosed with a "dry hip socket" and had to use a scooter to support the right leg for some months. He does not recall any other significant injury. 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 He is finding this more difficult to manage at home.  Past Medical History: Patient Active Problem List   Diagnosis Date Noted   Hypertension 03/01/2022   Chronic right-sided low back pain with right-sided sciatica 03/01/2022   Anxiety 03/01/2022   Chronic pain of left ankle 03/01/2022   Tobacco use 03/01/2022   Obesity (BMI 30-39.9) 03/01/2022   Past Surgical History:  Procedure Laterality Date   ELBOW BURSA SURGERY  2011   Right   I & D EXTREMITY  03/08/2012   Procedure: IRRIGATION AND DEBRIDEMENT EXTREMITY;  Surgeon: Newt Minion, MD;  Location: [facility];  Service: Orthopedics;  Laterality: Left;  Left Index Finger Irrigation and Debridement, Place  Antibiotic Beads   neck cyst removed     at age 57   History reviewed. No pertinent family history.  Outpatient Medications Prior to Visit  Medication Sig Dispense Refill   esomeprazole (NEXIUM) 20 MG capsule Take 20 mg by mouth daily at 12 noon.     methocarbamol (ROBAXIN) 500 MG tablet Take 1 tablet (500 mg total) by mouth 3 (three) times daily. 90 tablet 0   diclofenac (VOLTAREN) 75 MG EC tablet Take 1 tablet (75 mg total) by mouth 2 (two) times daily. 30 tablet 0   lansoprazole (PREVACID) 15 MG capsule Take 15 mg by mouth daily.     No facility-administered medications prior to visit.   No Known Allergies    Objective:   Today's Vitals   08/30/22 0805  BP: 138/80  Pulse: 83  Temp: (!) 97.4 F (36.3 C)  TempSrc: Temporal  SpO2: 95%  Weight: 232 lb 6.4 oz (105.4 kg)  Height: 5\' 10"  (1.778 m)   Body mass index is 33.35 kg/m.   General: Well developed, well nourished. No acute distress. Extremities: Limited ROM of hip. Increased pain with flexion of the hip joint and with FABER   maneuver. The right knee is not swollen. There is normal strength to the right leg muscles. Collateral   testing and Lachman's are negative. McMurray's demonstrates some clicking and pain with internal   rotation of the foot. Psych: Alert and  oriented. Normal mood and affect.  Health Maintenance Due  Topic Date Due   HIV Screening  Never done   Hepatitis C Screening  Never done   COLONOSCOPY (Pts 45-4yrs Insurance coverage will need to be confirmed)  Never done   Lung Cancer Screening  Never done   Imaging: Right hip x-ray- Narrowing of joint space consistent with degenerative arthritis.  Right knee x-ray- Right knee normal. Possible mild medial joint space narrowing of left knee.    Assessment & Plan:   1. Osteoarthritis of right hip, unspecified osteoarthritis type Exam and x-ray are consistent with degenerative arthritis of the right  hip joint. This may be secondary to juvenile hip  issues. I will prescribe and NSAID for pain management. I will refer Joel [name] to orthopedics for further evaluation.  - DG Hip Unilat W OR W/O Pelvis 2-3 Views Right - Ambulatory referral to Orthopedic Surgery - naproxen (NAPROSYN) 500 MG tablet; Take 1 tablet (500 mg total) by mouth 2 (two) times daily with a meal.  Dispense: 30 tablet; Refill: 1  2. Internal derangement of right knee Exam is consistent with a possible meniscal injury. Naproxen may helpw ith knee pain. I iwll have orthopedics assess this while they are seeing Joel [name] abotu his hip.  - DG Knee Complete 4 Views Right - Ambulatory referral to Orthopedic Surgery - naproxen (NAPROSYN) 500 MG tablet; Take 1 tablet (500 mg total) by mouth 2 (two) times daily with a meal.  Dispense: 30 tablet; Refill: 1   Return if symptoms worsen or fail to improve.   Haydee Salter, MD

## 2022-08-30 NOTE — Progress Notes (Unsigned)
Initial visit for chronic RT knee pain. Denies recent injury.

## 2022-08-31 ENCOUNTER — Ambulatory Visit: Payer: BC Managed Care – PPO | Admitting: Orthopaedic Surgery

## 2022-09-12 ENCOUNTER — Encounter: Payer: Self-pay | Admitting: Orthopaedic Surgery

## 2022-09-12 ENCOUNTER — Ambulatory Visit (INDEPENDENT_AMBULATORY_CARE_PROVIDER_SITE_OTHER): Payer: BC Managed Care – PPO | Admitting: Orthopaedic Surgery

## 2022-09-12 VITALS — Ht 70.0 in | Wt 232.0 lb

## 2022-09-12 DIAGNOSIS — M1611 Unilateral primary osteoarthritis, right hip: Secondary | ICD-10-CM | POA: Insufficient documentation

## 2022-09-12 MED ORDER — IBUPROFEN 800 MG PO TABS: 800.0000 mg | ORAL_TABLET | Freq: Three times a day (TID) | ORAL | 3 refills | Status: DC | PRN

## 2022-09-12 NOTE — Progress Notes (Signed)
Joel [name] is a 51 year old gentleman I am seeing actually for the first time.  I believe he has been seen by Dr. Alvester Morin before but for other issues.  He is brand-new to me.  He is sent from Dr. Herbie Drape to evaluate and treat known arthritis of his right hip.  He has been dealing with right hip and knee pain for several years now and an x-ray last month showed bone-on-bone wear of the right hip.  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  He is the sole provider for his grandkids as well.  He is not a diabetic.  He smokes on occasion.  His BMI is 33.29.  He does not have significant medical issues.  He says he has a hard time putting his shoes and socks on the right side and significant groin pain on the right side.  He says his pain is definitely affecting his mobility, his quality of life and his actives daily living.  I did review all of his notes within epic as well as his medications and medical history.  He currently denies any fever, chills, nausea, vomiting.  On exam he does walk with a slight limp.  He has significant limitations with range of motion of his right hip with severe pain with attempts of motion and decreased motion in general with the right hip.  His left hip moves more smoothly but it is still stiff on the left side as well.  X-rays of the pelvis and right hip shows severe end-stage arthritis of the right hip.  There is complete loss of the superior lateral joint space.  There are cystic changes and sclerotic changes as well as large periarticular osteophytes around the right hip.  At this point we did discuss in detail hip replacement surgery.  He would like to try to put this off until the spring.  I gave him our surgery scheduler's card.  I talked in length in detail about the risks and benefits of the surgery and what to expect from an intraoperative and postoperative course.  I showed him a hip replacement model and went over his x-rays.  I gave  him a handout about hip replacement surgery as well.  He will continue to work on hip strengthening exercises in the interim and I will send in some 800 mg ibuprofen for him to take as needed.  He says he would like that better than the naproxen that he has been on.  All questions and concerns were answered and addressed.

## 2022-10-24 ENCOUNTER — Other Ambulatory Visit: Payer: Self-pay | Admitting: Family Medicine

## 2022-10-24 DIAGNOSIS — M2391 Unspecified internal derangement of right knee: Secondary | ICD-10-CM

## 2022-10-24 DIAGNOSIS — M1611 Unilateral primary osteoarthritis, right hip: Secondary | ICD-10-CM

## 2022-10-25 NOTE — Telephone Encounter (Signed)
Left patient a detailed voice message to return call to office regarding annotation below  

## 2022-10-25 NOTE — Telephone Encounter (Signed)
Patient is aware of annotation below and verbalized understanding.  

## 2022-11-21 ENCOUNTER — Other Ambulatory Visit: Payer: Self-pay | Admitting: Nurse Practitioner

## 2022-11-21 DIAGNOSIS — M1611 Unilateral primary osteoarthritis, right hip: Secondary | ICD-10-CM

## 2022-11-21 DIAGNOSIS — M2391 Unspecified internal derangement of right knee: Secondary | ICD-10-CM

## 2022-11-28 ENCOUNTER — Encounter: Payer: Self-pay | Admitting: Family Medicine

## 2022-11-28 ENCOUNTER — Ambulatory Visit (INDEPENDENT_AMBULATORY_CARE_PROVIDER_SITE_OTHER): Payer: BC Managed Care – PPO | Admitting: Family Medicine

## 2022-11-28 VITALS — BP 138/86 | HR 79 | Temp 97.5°F | Wt 240.4 lb

## 2022-11-28 DIAGNOSIS — E669 Obesity, unspecified: Secondary | ICD-10-CM | POA: Diagnosis not present

## 2022-11-28 DIAGNOSIS — E785 Hyperlipidemia, unspecified: Secondary | ICD-10-CM | POA: Diagnosis not present

## 2022-11-28 DIAGNOSIS — Z01818 Encounter for other preprocedural examination: Secondary | ICD-10-CM

## 2022-11-28 DIAGNOSIS — Z6834 Body mass index (BMI) 34.0-34.9, adult: Secondary | ICD-10-CM | POA: Diagnosis not present

## 2022-11-28 DIAGNOSIS — K219 Gastro-esophageal reflux disease without esophagitis: Secondary | ICD-10-CM | POA: Diagnosis not present

## 2022-11-28 DIAGNOSIS — M1611 Unilateral primary osteoarthritis, right hip: Secondary | ICD-10-CM

## 2022-11-28 LAB — LIPID PANEL
Cholesterol: 206 mg/dL — ABNORMAL HIGH (ref 0–200)
HDL: 38.9 mg/dL — ABNORMAL LOW (ref >39.00)
Total CHOL/HDL Ratio: 5
Triglycerides: 477 mg/dL — ABNORMAL HIGH (ref 0.0–149.0)

## 2022-11-28 LAB — CBC WITH DIFFERENTIAL/PLATELET
Basophils Absolute: 0.1 10*3/uL (ref 0.0–0.1)
Basophils Relative: 1.3 % (ref 0.0–3.0)
Eosinophils Absolute: 0.1 10*3/uL (ref 0.0–0.7)
Eosinophils Relative: 1.3 % (ref 0.0–5.0)
HCT: 45.1 % (ref 39.0–52.0)
Hemoglobin: 15.3 g/dL (ref 13.0–17.0)
Lymphocytes Relative: 26.7 % (ref 12.0–46.0)
Lymphs Abs: 2.4 10*3/uL (ref 0.7–4.0)
MCHC: 33.9 g/dL (ref 30.0–36.0)
MCV: 93.9 fl (ref 78.0–100.0)
Monocytes Absolute: 0.8 10*3/uL (ref 0.1–1.0)
Monocytes Relative: 8.7 % (ref 3.0–12.0)
Neutro Abs: 5.5 10*3/uL (ref 1.4–7.7)
Neutrophils Relative %: 62 % (ref 43.0–77.0)
Platelets: 190 10*3/uL (ref 150.0–400.0)
RBC: 4.8 Mil/uL (ref 4.22–5.81)
RDW: 13.6 % (ref 11.5–15.5)
WBC: 8.8 10*3/uL (ref 4.0–10.5)

## 2022-11-28 LAB — LDL CHOLESTEROL, DIRECT: Direct LDL: 109 mg/dL

## 2022-11-28 LAB — TSH: TSH: 3.38 u[IU]/mL (ref 0.35–5.50)

## 2022-11-28 LAB — MICROALBUMIN / CREATININE URINE RATIO
Creatinine,U: 41.2 mg/dL
Microalb Creat Ratio: 1.7 mg/g (ref 0.0–30.0)
Microalb, Ur: <0.7 mg/dL (ref 0.0–1.9)

## 2022-11-28 LAB — COMPREHENSIVE METABOLIC PANEL
ALT: 34 U/L (ref 0–53)
AST: 20 U/L (ref 0–37)
Albumin: 4.4 g/dL (ref 3.5–5.2)
Alkaline Phosphatase: 44 U/L (ref 39–117)
BUN: 11 mg/dL (ref 6–23)
CO2: 29 mEq/L (ref 19–32)
Calcium: 9.6 mg/dL (ref 8.4–10.5)
Chloride: 101 mEq/L (ref 96–112)
Creatinine, Ser: 0.77 mg/dL (ref 0.40–1.50)
GFR: 102.9 mL/min (ref >60.00)
Glucose, Bld: 98 mg/dL (ref 70–99)
Potassium: 4.6 mEq/L (ref 3.5–5.1)
Sodium: 140 mEq/L (ref 135–145)
Total Bilirubin: 0.3 mg/dL (ref 0.2–1.2)
Total Protein: 6.7 g/dL (ref 6.0–8.3)

## 2022-11-28 LAB — HEMOGLOBIN A1C: Hgb A1c MFr Bld: 6.2 % (ref 4.6–6.5)

## 2022-11-28 MED ORDER — ROSUVASTATIN CALCIUM 20 MG PO TABS: 20.0000 mg | ORAL_TABLET | Freq: Every day | ORAL | 3 refills | Status: DC

## 2022-11-28 MED ORDER — OMEPRAZOLE 40 MG PO CPDR: 40.0000 mg | DELAYED_RELEASE_CAPSULE | Freq: Two times a day (BID) | ORAL | 0 refills | Status: DC

## 2022-11-28 MED ORDER — FAMOTIDINE 40 MG PO TABS: 40.0000 mg | ORAL_TABLET | Freq: Every day | ORAL | 0 refills | Status: DC

## 2022-11-28 MED ORDER — MELOXICAM 15 MG PO TABS: 15.0000 mg | ORAL_TABLET | Freq: Every day | ORAL | 0 refills | Status: DC

## 2022-11-28 MED ORDER — TRAMADOL HCL 50 MG PO TABS: 50.0000 mg | ORAL_TABLET | Freq: Three times a day (TID) | ORAL | 0 refills | Status: DC | PRN

## 2022-11-28 NOTE — Assessment & Plan Note (Signed)
The primary concern is the right hip pain, which has significantly impacted the patient's quality of life. Considering a hip replacement surgery is forthcoming, pain management is crucial to maintain function and prepare for postoperative recovery. The pain is consistent with osteoarthritis given the chronic nature and previous consultations, possibly exacerbated by overexertion from the patient's work activities.  Plan: Prescribe tramadol for pain management as it is less likely to cause dependency compared to other opioids, with a prescription for 60 days. Additionally, prescribe famotidine to be taken at night to help with gastrointestinal symptoms exacerbated by NSAIDs. Discontinue naproxen. Consider starting meloxicam, another NSAID, while closely monitoring the patient's gastrointestinal symptoms. Recommend a follow-up visit for reevaluation of pain management effectiveness and tolerance of the new medication regimen.

## 2022-11-28 NOTE — Addendum Note (Signed)
Addended by: Josephine Igo B on: 11/28/2022 03:01 PM   Modules accepted: Orders

## 2022-11-28 NOTE — Progress Notes (Signed)
Assessment/Plan:   Problem List Items Addressed This Visit       Digestive   Gastroesophageal reflux disease    Joel [name] experiences heartburn for which he has been taking Nexium without insurance coverage.  Plan: Switch to omeprazole from Nexium to potentially lower Joel [name]'s out-of-pocket expense while still managing his GERD symptoms. Evaluate cost coverage Joel affordability with Joel [name]'s insurance.      Relevant Medications   famotidine (PEPCID) 40 MG tablet   omeprazole (PRILOSEC) 40 MG capsule     Musculoskeletal Joel Integument   Unilateral primary osteoarthritis, right hip - Primary    Joel primary concern is Joel right hip pain, which has significantly impacted Joel [name]'s quality of life. Considering a hip replacement surgery is forthcoming, pain management is crucial to maintain function Joel prepare for postoperative recovery. Joel pain is consistent with osteoarthritis given Joel chronic nature Joel previous consultations, possibly exacerbated by overexertion from Joel [name]'s work activities.  Plan: Prescribe tramadol for pain management as it is less likely to cause dependency compared to other opioids, with a prescription for 60 days. Additionally, prescribe famotidine to be taken at night to help with gastrointestinal symptoms exacerbated by NSAIDs. Discontinue naproxen. Consider starting meloxicam, another NSAID, while closely monitoring Joel [name]'s gastrointestinal symptoms. Recommend a follow-up visit for reevaluation of pain management effectiveness Joel tolerance of Joel new medication regimen.      Relevant Medications   meloxicam (MOBIC) 15 MG tablet   traMADol (ULTRAM) 50 MG tablet   Other Visit Diagnoses     Hyperlipidemia, unspecified hyperlipidemia type       Relevant Orders   TSH   Lipid panel   Hemoglobin A1c   Microalbumin / creatinine urine ratio   Urinalysis, Routine w reflex microscopic   CBC with Differential/Platelet    Comprehensive metabolic panel   Class 1 obesity with serious comorbidity Joel body mass index (BMI) of 34.0 to 34.9 in adult, unspecified obesity type       Relevant Orders   TSH   Lipid panel   Hemoglobin A1c   Microalbumin / creatinine urine ratio   Urinalysis, Routine w reflex microscopic   CBC with Differential/Platelet   Comprehensive metabolic panel   Pre-op evaluation           Medications Discontinued During This Encounter  Medication Reason   naproxen (NAPROSYN) 500 MG tablet    methocarbamol (ROBAXIN) 500 MG tablet    ibuprofen (ADVIL) 800 MG tablet    esomeprazole (NEXIUM) 20 MG capsule       Subjective:  HPI: Encounter date: 11/28/2022  Joel [name] is a 52 y.o. male who has Hypertension; Chronic right-sided low back pain with right-sided sciatica; Anxiety; Chronic pain of left ankle; Tobacco use; Obesity (BMI 30-39.9); Unilateral primary osteoarthritis, right hip; Joel Gastroesophageal reflux disease on their problem list..   He  has a past medical history of GERD (gastroesophageal reflux disease), Infection of index finger, Joel Kidney stones..   CHIEF COMPLAINT: Joel [name], a 52 year old male, presents for follow-up on right hip pain Joel management prior to Joel scheduled right hip replacement surgery.  HISTORY OF PRESENT ILLNESS:  Problem 1: Joel [name] has been experiencing severe right hip pain, which has been worsening over Joel past one Joel a half to two weeks. He attributes Joel exacerbation of pain to overexertion at work. He has been preparing for an upcoming surgery scheduled for March 19th in Joel morning Joel is concerned about managing pain in  Joel interim. Thayer Ohm has a history of hip pain Joel consulted Dr. Magnus Ivan back in November. His use of naproxen has been long-term for both Joel pain Joel arthritis relief; however, Joel benefits of naproxen seem to be diminishing. He also tried Ibuprofen 800 mg, which led to stomach upset Joel was subsequently  discontinued.  Joel [name] works at [employer] for 8 hours per day, Monday through Friday. He avoids heavy lifting over 50 pounds.  Problem 2: Joel [name] is also dealing with heartburn Joel has been taking Nexium twice daily, which he pays for out-of-pocket. He is interested in finding a more affordable alternative.  REVIEW OF SYSTEMS: Positive for joint pain Joel heartburn. Remaining systems reviewed Joel negative.  Chales Abrahams Perioperative Risk for Myocardial Infarction or Cardiac Arrest (MICA) from [email or web address]  on 11/28/2022 ** All calculations should be rechecked by clinician prior to use **  RESULT SUMMARY: 0.1 % Risk of myocardial infarction or cardiac arrest, intraoperatively or up to 30 days post-op   INPUTS: Age --> 51 years Functional status --> 0 = Independent ASA class --> -3.29 = 2: mild systemic disease Creatinine --> 0 = Normal (?1.5 mg/dL, 409 mol/L)  Past Surgical History:  Procedure Laterality Date   ELBOW BURSA SURGERY  2011   Right   I & D EXTREMITY  03/08/2012   Procedure: IRRIGATION Joel DEBRIDEMENT EXTREMITY;  Surgeon: Nadara Mustard, MD;  Location: [facility] OR;  Service: Orthopedics;  Laterality: Left;  Left Index Finger Irrigation Joel Debridement, Place Antibiotic Beads   neck cyst removed     at age 80    Outpatient Medications Prior to Visit  Medication Sig Dispense Refill   esomeprazole (NEXIUM) 20 MG capsule Take 20 mg by mouth daily at 12 noon.     naproxen (NAPROSYN) 500 MG tablet TAKE 1 TABLET (500 MG TOTAL) BY MOUTH 2 (TWO) TIMES DAILY WITH A MEAL. DO NOT TAKE WITH IBUPROFEN. 60 tablet 0   ibuprofen (ADVIL) 800 MG tablet Take 1 tablet (800 mg total) by mouth every 8 (eight) hours as needed. ([name] not taking: Reported on 11/28/2022) 60 tablet 3   methocarbamol (ROBAXIN) 500 MG tablet Take 1 tablet (500 mg total) by mouth 3 (three) times daily. ([name] not taking: Reported on 11/28/2022) 90 tablet 0   No facility-administered medications prior to visit.     No family history on file.  Social History   Socioeconomic History   Marital status: Divorced    Spouse name: Not on file   Number of children: Not on file   Years of education: Not on file   Highest education level: Not on file  Occupational History   Not on file  Tobacco Use   Smoking status: Every Day    Packs/day: 10.00    Years: 6.00    Total pack years: 60.00    Types: Cigarettes    Passive exposure: Yes   Smokeless tobacco: Former    Types: Associate Professor Use: Never used  Substance Joel Sexual Activity   Alcohol use: No   Drug use: No   Sexual activity: Yes  Other Topics Concern   Not on file  Social History Narrative   Not on file   Social Determinants of Health   Financial Resource Strain: Not on file  Food Insecurity: Not on file  Transportation Needs: Not on file  Physical Activity: Not on file  Stress: Not on file  Social Connections: Not on file  Intimate Partner  Violence: Not on file                                                                                                 Objective:  Physical Exam: BP 138/86 (BP Location: Left Arm, [name] Position: Sitting, Cuff Size: Large)   Pulse 79   Temp (!) 97.5 F (36.4 C) (Temporal)   Wt 240 lb 6.4 oz (109 kg)   SpO2 98%   BMI 34.49 kg/m    General: No acute distress. Awake Joel conversant.  Eyes: Normal conjunctiva, anicteric. Round symmetric pupils.  ENT: Hearing grossly intact. No nasal discharge.  Neck: Neck is supple. No masses or thyromegaly.  Respiratory: Respirations are non-labored. No auditory wheezing.  Skin: Warm. No rashes or ulcers.  Psych: Alert Joel oriented. Cooperative, Appropriate mood Joel affect, Normal judgment.  CV: No cyanosis or JVD MSK: Normal ambulation. No clubbing  Neuro: Sensation Joel CN II-XII grossly normal.        Alesia Banda, MD, MS

## 2022-11-28 NOTE — Assessment & Plan Note (Signed)
The patient experiences heartburn for which he has been taking Nexium without insurance coverage.  Plan: Switch to omeprazole from Nexium to potentially lower the patient's out-of-pocket expense while still managing his GERD symptoms. Evaluate cost coverage and affordability with the patient's insurance.

## 2022-12-02 ENCOUNTER — Telehealth: Payer: Self-pay

## 2022-12-02 DIAGNOSIS — M1611 Unilateral primary osteoarthritis, right hip: Secondary | ICD-10-CM

## 2022-12-02 NOTE — Telephone Encounter (Signed)
PA started for Tramadol through covermymeds.

## 2022-12-08 NOTE — Telephone Encounter (Signed)
PA denied for tramadol 50 mg tablet.  Reason for denial: The requested service is not a covered benefit per your benefit booklet or plan documents.   Please advise

## 2022-12-13 ENCOUNTER — Other Ambulatory Visit: Payer: Self-pay

## 2022-12-13 MED ORDER — TRAMADOL HCL 50 MG PO TABS: 50.0000 mg | ORAL_TABLET | Freq: Three times a day (TID) | ORAL | 0 refills | Status: AC | PRN

## 2022-12-13 NOTE — Addendum Note (Signed)
Addended by: Josephine Igo B on: 12/13/2022 10:06 AM   Modules accepted: Orders

## 2022-12-13 NOTE — Telephone Encounter (Signed)
Patient is aware of annotation below and verbalized understanding.  

## 2022-12-20 ENCOUNTER — Other Ambulatory Visit: Payer: Self-pay | Admitting: Family Medicine

## 2022-12-20 DIAGNOSIS — K219 Gastro-esophageal reflux disease without esophagitis: Secondary | ICD-10-CM

## 2022-12-20 NOTE — Telephone Encounter (Signed)
Chart supports rx. Last OV: 11/28/2022

## 2022-12-25 ENCOUNTER — Other Ambulatory Visit: Payer: Self-pay | Admitting: Family Medicine

## 2022-12-25 DIAGNOSIS — M1611 Unilateral primary osteoarthritis, right hip: Secondary | ICD-10-CM

## 2022-12-26 ENCOUNTER — Other Ambulatory Visit: Payer: Self-pay | Admitting: Physician Assistant

## 2022-12-26 DIAGNOSIS — Z01818 Encounter for other preprocedural examination: Secondary | ICD-10-CM

## 2022-12-26 NOTE — Telephone Encounter (Signed)
Chart supports rx. Last OV: 11/28/2022

## 2022-12-28 ENCOUNTER — Other Ambulatory Visit: Payer: Self-pay | Admitting: Family Medicine

## 2022-12-28 DIAGNOSIS — G8929 Other chronic pain: Secondary | ICD-10-CM

## 2023-01-05 ENCOUNTER — Encounter: Payer: Self-pay | Admitting: Radiology

## 2023-01-10 ENCOUNTER — Telehealth: Payer: Self-pay | Admitting: Orthopaedic Surgery

## 2023-01-10 NOTE — Telephone Encounter (Signed)
[place] forms received. To [employer]. 

## 2023-01-11 ENCOUNTER — Encounter (HOSPITAL_COMMUNITY): Payer: Self-pay

## 2023-01-11 NOTE — Progress Notes (Signed)
Surgical Instructions    Your procedure is scheduled on Tuesday, January 17, 2023.  Report to [facility] Main Entrance "A" at 5:30 A.M., then check in with the Admitting office.  Call this number if you have problems the morning of surgery:  [phone]   If you have any questions prior to your surgery date call [phone]: Open Monday-Friday 8am-4pm If you experience any cold or flu symptoms such as cough, fever, chills, shortness of breath, etc. between now and your scheduled surgery, please notify us at the above number     Remember:  Do not eat after midnight the night before your surgery  You may drink clear liquids until 4:30 the morning of your surgery.   Clear liquids allowed are: Water, Non-Citrus Juices (without pulp), Carbonated Beverages, Clear Tea, Black Coffee ONLY (NO MILK, CREAM OR POWDERED CREAMER of any kind), and Gatorade  Patient Instructions  The night before surgery:  No food after midnight. ONLY clear liquids after midnight  The day of surgery (if you do NOT have diabetes):  Drink ONE (1) Pre-Surgery Clear Ensure by 4:30 the morning of surgery. Drink in one sitting. Do not sip.  This drink was given to you during your hospital  pre-op appointment visit.  Nothing else to drink after completing the  Pre-Surgery Clear Ensure.          If you have questions, please contact your surgeon's office.    Take these medicines the morning of surgery with A SIP OF WATER:  rosuvastatin (CRESTOR)    As of today, STOP taking any Aspirin (unless otherwise instructed by your surgeon) Aleve, Naproxen, Ibuprofen, Motrin, Advil, Goody's, BC's, all herbal medications, fish oil, and all vitamins. This includes your meloxicam (MOBIC).            Do NOT Smoke (Tobacco/Vaping)  24 hours prior to your procedure  If you use a CPAP at night, you may bring your mask for your overnight stay.   Contacts, glasses, hearing aids, dentures or partials may not be worn into surgery,  please bring cases for these belongings   For patients admitted to the hospital, discharge time will be determined by your treatment team.   Patients discharged the day of surgery will not be allowed to drive home, and someone needs to stay with them for 24 hours.   SURGICAL WAITING ROOM VISITATION Patients having surgery or a procedure may have no more than 2 support people in the waiting area - these visitors may rotate.   Children under the age of 26 must have an adult with them who is not the patient. If the patient needs to stay at the hospital during part of their recovery, the visitor guidelines for inpatient rooms apply. Pre-op nurse will coordinate an appropriate time for 1 support person to accompany patient in pre-op.  This support person may not rotate.   Please refer to [email or web address] for the visitor guidelines for Inpatients (after your surgery is over and you are in a regular room).    Special instructions:    Oral Hygiene is also important to reduce your risk of infection.  Remember - BRUSH YOUR TEETH THE MORNING OF SURGERY WITH YOUR REGULAR TOOTHPASTE   [place]- Preparing For Surgery  Before surgery, you can play an important role. Because skin is not sterile, your skin needs to be as free of germs as possible. You can reduce the number of germs on your skin by washing with CHG (chlorahexidine gluconate) Soap before  surgery.  CHG is an antiseptic cleaner which kills germs and bonds with the skin to continue killing germs even after washing.     Please do not use if you have an allergy to CHG or antibacterial soaps. If your skin becomes reddened/irritated stop using the CHG.  Do not shave (including legs and underarms) for at least 48 hours prior to first CHG shower. It is OK to shave your face.  Please follow these instructions carefully.     Shower the NIGHT BEFORE SURGERY and the MORNING OF SURGERY with CHG  Soap.   If you chose to wash your hair, wash your hair first as usual with your normal shampoo. After you shampoo, rinse your hair and body thoroughly to remove the shampoo.  Then [employer] and genitals (private parts) with your normal soap and rinse thoroughly to remove soap.  After that Use CHG Soap as you would any other liquid soap. You can apply CHG directly to the skin and wash gently with a scrungie or a clean washcloth.   Apply the CHG Soap to your body ONLY FROM THE NECK DOWN.  Do not use on open wounds or open sores. Avoid contact with your eyes, ears, mouth and genitals (private parts). Wash Face and genitals (private parts)  with your normal soap.   Wash thoroughly, paying special attention to the area where your surgery will be performed.  Thoroughly rinse your body with warm water from the neck down.  DO NOT shower/wash with your normal soap after using and rinsing off the CHG Soap.  Pat yourself dry with a CLEAN TOWEL.  Wear CLEAN PAJAMAS to bed the night before surgery  Place CLEAN SHEETS on your bed the night before your surgery  DO NOT SLEEP WITH PETS.   Day of Surgery:  Take a shower with CHG soap. Wear Clean/Comfortable clothing the morning of surgery Do not apply any deodorants/lotions.   Remember to brush your teeth WITH YOUR REGULAR TOOTHPASTE.  Do not wear jewelry Do not wear lotions, powders, cologne or deodorant. Men may shave face and neck. Do not bring valuables to the hospital.  [facility] is not responsible for any belongings or valuables.     If you received a COVID test during your pre-op visit, it is requested that you wear a mask when out in public, stay away from anyone that may not be feeling well, and notify your surgeon if you develop symptoms. If you have been in contact with anyone that has tested positive in the last 10 days, please notify your surgeon.    Please read over the following fact sheets that you were given.

## 2023-01-12 ENCOUNTER — Encounter (HOSPITAL_COMMUNITY): Payer: Self-pay

## 2023-01-12 ENCOUNTER — Other Ambulatory Visit: Payer: Self-pay

## 2023-01-12 ENCOUNTER — Encounter (HOSPITAL_COMMUNITY)
Admission: RE | Admit: 2023-01-12 | Discharge: 2023-01-12 | Disposition: A | Discharge status: Home / Self Care | Payer: BC Managed Care – PPO | Source: Ambulatory Visit | Attending: Orthopaedic Surgery | Admitting: Orthopaedic Surgery

## 2023-01-12 VITALS — BP 151/75 | HR 78 | Temp 98.6°F | Resp 18 | Ht 71.0 in | Wt 235.9 lb

## 2023-01-12 DIAGNOSIS — Z01812 Encounter for preprocedural laboratory examination: Secondary | ICD-10-CM | POA: Diagnosis present

## 2023-01-12 DIAGNOSIS — Z01818 Encounter for other preprocedural examination: Secondary | ICD-10-CM

## 2023-01-12 HISTORY — DX: Pure hypercholesterolemia, unspecified: E78.00

## 2023-01-12 HISTORY — DX: Personal history of urinary calculi: Z87.442

## 2023-01-12 HISTORY — DX: Unspecified osteoarthritis, unspecified site: M19.90

## 2023-01-12 LAB — COMPREHENSIVE METABOLIC PANEL
ALT: 44 U/L (ref 0–44)
AST: 29 U/L (ref 15–41)
Albumin: 3.9 g/dL (ref 3.5–5.0)
Alkaline Phosphatase: 38 U/L (ref 38–126)
Anion gap: 10 (ref 5–15)
BUN: 18 mg/dL (ref 6–20)
CO2: 26 mmol/L (ref 22–32)
Calcium: 9.2 mg/dL (ref 8.9–10.3)
Chloride: 99 mmol/L (ref 98–111)
Creatinine, Ser: 0.86 mg/dL (ref 0.61–1.24)
GFR, Estimated: >60 mL/min (ref >60)
Glucose, Bld: 116 mg/dL — ABNORMAL HIGH (ref 70–99)
Potassium: 4.3 mmol/L (ref 3.5–5.1)
Sodium: 135 mmol/L (ref 135–145)
Total Bilirubin: 0.6 mg/dL (ref 0.3–1.2)
Total Protein: 6.4 g/dL — ABNORMAL LOW (ref 6.5–8.1)

## 2023-01-12 LAB — CBC
HCT: 43 % (ref 39.0–52.0)
Hemoglobin: 14.8 g/dL (ref 13.0–17.0)
MCH: 32.2 pg (ref 26.0–34.0)
MCHC: 34.4 g/dL (ref 30.0–36.0)
MCV: 93.7 fL (ref 80.0–100.0)
Platelets: 211 10*3/uL (ref 150–400)
RBC: 4.59 MIL/uL (ref 4.22–5.81)
RDW: 12.8 % (ref 11.5–15.5)
WBC: 10.2 10*3/uL (ref 4.0–10.5)
nRBC: 0 % (ref 0.0–0.2)

## 2023-01-12 LAB — SURGICAL PCR SCREEN
MRSA, PCR: NEGATIVE
Staphylococcus aureus: NEGATIVE

## 2023-01-12 LAB — TYPE AND SCREEN
ABO/RH(D): A POS
Antibody Screen: NEGATIVE

## 2023-01-12 NOTE — Progress Notes (Signed)
PCP - Dr. Josephine Igo Cardiologist -  Denies  PPM/ICD - Denies  Chest x-ray - 03/08/2012 EKG - 03/10/2012 Stress Test - Denies ECHO - Denies Cardiac Cath - Denies  Sleep Study - Denies CPAP - Denies  No-diabetic Last dose of GLP1 agonist-  n/a GLP1 instructions: n/a  Blood Thinner Instructions:Denies Aspirin Instructions:Denies  ERAS Protcol - Yes PRE-SURGERY Ensure  COVID TEST- Denies   Anesthesia review: No  Patient denies shortness of breath, fever, cough and chest pain at PAT appointment   All instructions explained to the patient, with a verbal understanding of the material. Patient agrees to go over the instructions while at home for a better understanding. Patient also instructed to self quarantine after being tested for COVID-19. The opportunity to ask questions was provided.

## 2023-01-16 NOTE — Anesthesia Preprocedure Evaluation (Signed)
Anesthesia Evaluation  Patient identified by MRN, date of birth, ID band Patient awake    Reviewed: Allergy & Precautions, H&P , NPO status , Patient's Chart, lab work & pertinent test results  Airway Mallampati: II  TM Distance: >3 FB Neck ROM: Full    Dental no notable dental hx. (+) Teeth Intact, Dental Advisory Given   Pulmonary Current SmokerPatient did not abstain from smoking.   Pulmonary exam normal breath sounds clear to auscultation       Cardiovascular Exercise Tolerance: Good negative cardio ROS  Rhythm:Regular Rate:Normal     Neuro/Psych   Anxiety     negative neurological ROS     GI/Hepatic Neg liver ROS,GERD  Medicated,,  Endo/Other  negative endocrine ROS    Renal/GU negative Renal ROS  negative genitourinary   Musculoskeletal  (+) Arthritis , Osteoarthritis,    Abdominal   Peds  Hematology negative hematology ROS (+)   Anesthesia Other Findings   Reproductive/Obstetrics negative OB ROS                             Anesthesia Physical Anesthesia Plan  ASA: 2  Anesthesia Plan: Spinal   Post-op Pain Management: Tylenol PO (pre-op)* and Toradol IV (intra-op)*   Induction: Intravenous  PONV Risk Score and Plan: 1 and Propofol infusion, Midazolam and Ondansetron  Airway Management Planned: Natural Airway and Simple Face Mask  Additional Equipment:   Intra-op Plan:   Post-operative Plan:   Informed Consent: I have reviewed the patients History and Physical, chart, labs and discussed the procedure including the risks, benefits and alternatives for the proposed anesthesia with the patient or authorized representative who has indicated his/her understanding and acceptance.     Dental advisory given  Plan Discussed with: CRNA  Anesthesia Plan Comments:        Anesthesia Quick Evaluation

## 2023-01-16 NOTE — H&P (Signed)
TOTAL HIP ADMISSION H&P  Patient is admitted for right total hip arthroplasty.  Subjective:  Chief Complaint: right hip pain  HPI: Joel [name], 52 y.o. male, has a history of pain and functional disability in the right hip(s) due to arthritis and patient has failed non-surgical conservative treatments for greater than 12 weeks to include NSAID's and/or analgesics, corticosteriod injections, weight reduction as appropriate, and activity modification.  Onset of symptoms was gradual starting 3 years ago with gradually worsening course since that time.The patient noted no past surgery on the right hip(s).  Patient currently rates pain in the right hip at 10 out of 10 with activity. Patient has night pain, worsening of pain with activity and weight bearing, pain that interfers with activities of daily living, and pain with passive range of motion. Patient has evidence of subchondral sclerosis, periarticular osteophytes, and joint space narrowing by imaging studies. This condition presents safety issues increasing the risk of falls.  There is no current active infection.  Patient Active Problem List   Diagnosis Date Noted   Gastroesophageal reflux disease 11/28/2022   Unilateral primary osteoarthritis, right hip 09/12/2022   Hypertension 03/01/2022   Chronic right-sided low back pain with right-sided sciatica 03/01/2022   Anxiety 03/01/2022   Chronic pain of left ankle 03/01/2022   Tobacco use 03/01/2022   Obesity (BMI 30-39.9) 03/01/2022   Past Medical History:  Diagnosis Date   Arthritis    GERD (gastroesophageal reflux disease)    takes Prilosec bid   High cholesterol    History of kidney stones    Infection of index finger    dog bite    Past Surgical History:  Procedure Laterality Date   ELBOW BURSA SURGERY  2011   Right   I & D EXTREMITY  03/08/2012   Procedure: IRRIGATION AND DEBRIDEMENT EXTREMITY;  Surgeon: Newt Minion, MD;  Location: [place];  Service: Orthopedics;   Laterality: Left;  Left Index Finger Irrigation and Debridement, Place Antibiotic Beads   neck cyst removed     at age 35    No current facility-administered medications for this encounter.   Current Outpatient Medications  Medication Sig Dispense Refill Last Dose   esomeprazole (NEXIUM) 20 MG capsule Take 20 mg by mouth daily at 12 noon.      ibuprofen (ADVIL) 800 MG tablet Take 800 mg by mouth every 8 (eight) hours as needed for moderate pain.      meloxicam (MOBIC) 15 MG tablet TAKE 1 TABLET (15 MG TOTAL) BY MOUTH DAILY. 30 tablet 0    Menthol, Topical Analgesic, (ICY HOT EX) Apply 1 Application topically daily as needed (pain).      Multiple Vitamins-Minerals (MULTIVITAMIN GUMMIES ADULT PO) Take 2 tablets by mouth daily.      rosuvastatin (CRESTOR) 20 MG tablet Take 1 tablet (20 mg total) by mouth daily. 90 tablet 3    famotidine (PEPCID) 40 MG tablet TAKE 1 TABLET BY MOUTH EVERYDAY AT BEDTIME (Patient not taking: Reported on 01/10/2023) 90 tablet 1 Not Taking   omeprazole (PRILOSEC) 40 MG capsule Take 1 capsule (40 mg total) by mouth 2 (two) times daily before a meal. (Patient not taking: Reported on 01/10/2023) 180 capsule 0 Not Taking   No Known Allergies  Social History   Tobacco Use   Smoking status: Every Day    Packs/day: 0.50    Years: 6.00    Additional pack years: 0.00    Total pack years: 3.00    Types:  Cigarettes    Passive exposure: Yes   Smokeless tobacco: Former    Types: Chew  Substance Use Topics   Alcohol use: Yes    Comment: occasional    No family history on file.   Review of Systems  All other systems reviewed and are negative.   Objective:  Physical Exam Vitals reviewed.  Constitutional:      Appearance: Normal appearance.  HENT:     Head: Normocephalic and atraumatic.  Eyes:     Extraocular Movements: Extraocular movements intact.     Pupils: Pupils are equal, round, and reactive to light.  Cardiovascular:     Rate and Rhythm: Normal rate  and regular rhythm.     Pulses: Normal pulses.  Pulmonary:     Effort: Pulmonary effort is normal.     Breath sounds: Normal breath sounds.  Abdominal:     Palpations: Abdomen is soft.  Musculoskeletal:     Cervical back: Normal range of motion and neck supple.     Right hip: Tenderness and bony tenderness present. Decreased range of motion. Decreased strength.  Neurological:     Mental Status: He is alert and oriented to person, place, and time.  Psychiatric:        Behavior: Behavior normal.     Vital signs in last 24 hours:    Labs:   Estimated body mass index is 32.9 kg/m as calculated from the following:   Height as of 01/12/23: 5\' 11"  (1.803 m).   Weight as of 01/12/23: 107 kg.   Imaging Review Plain radiographs demonstrate severe degenerative joint disease of the right hip(s). The bone quality appears to be excellent for age and reported activity level.      Assessment/Plan:  End stage arthritis, right hip(s)  The patient history, physical examination, clinical judgement of the provider and imaging studies are consistent with end stage degenerative joint disease of the right hip(s) and total hip arthroplasty is deemed medically necessary. The treatment options including medical management, injection therapy, arthroscopy and arthroplasty were discussed at length. The risks and benefits of total hip arthroplasty were presented and reviewed. The risks due to aseptic loosening, infection, stiffness, dislocation/subluxation,  thromboembolic complications and other imponderables were discussed.  The patient acknowledged the explanation, agreed to proceed with the plan and consent was signed. Patient is being admitted for inpatient treatment for surgery, pain control, PT, OT, prophylactic antibiotics, VTE prophylaxis, progressive ambulation and ADL's and discharge planning.The patient is planning to be discharged home with home health services

## 2023-01-17 ENCOUNTER — Other Ambulatory Visit: Payer: Self-pay

## 2023-01-17 ENCOUNTER — Ambulatory Visit (HOSPITAL_COMMUNITY): Payer: BC Managed Care – PPO | Admitting: Anesthesiology

## 2023-01-17 ENCOUNTER — Encounter (HOSPITAL_COMMUNITY): Payer: Self-pay | Admitting: Orthopaedic Surgery

## 2023-01-17 ENCOUNTER — Ambulatory Visit (HOSPITAL_COMMUNITY): Payer: BC Managed Care – PPO

## 2023-01-17 ENCOUNTER — Observation Stay (HOSPITAL_COMMUNITY)
Admission: RE | Admit: 2023-01-17 | Discharge: 2023-01-17 | Disposition: A | Discharge status: Home / Self Care | Payer: BC Managed Care – PPO | Attending: Orthopaedic Surgery | Admitting: Orthopaedic Surgery

## 2023-01-17 ENCOUNTER — Observation Stay (HOSPITAL_COMMUNITY): Payer: BC Managed Care – PPO

## 2023-01-17 ENCOUNTER — Encounter (HOSPITAL_COMMUNITY)
Admission: RE | Disposition: A | Discharge status: Home / Self Care | Payer: Self-pay | Source: Home / Self Care | Attending: Orthopaedic Surgery

## 2023-01-17 DIAGNOSIS — I10 Essential (primary) hypertension: Secondary | ICD-10-CM | POA: Diagnosis not present

## 2023-01-17 DIAGNOSIS — F17210 Nicotine dependence, cigarettes, uncomplicated: Secondary | ICD-10-CM | POA: Insufficient documentation

## 2023-01-17 DIAGNOSIS — Z79899 Other long term (current) drug therapy: Secondary | ICD-10-CM | POA: Diagnosis not present

## 2023-01-17 DIAGNOSIS — Z96641 Presence of right artificial hip joint: Secondary | ICD-10-CM

## 2023-01-17 DIAGNOSIS — M1611 Unilateral primary osteoarthritis, right hip: Secondary | ICD-10-CM | POA: Diagnosis present

## 2023-01-17 HISTORY — PX: TOTAL HIP ARTHROPLASTY: SHX124

## 2023-01-17 LAB — ABO/RH: ABO/RH(D): A POS

## 2023-01-17 SURGERY — ARTHROPLASTY, HIP, TOTAL, ANTERIOR APPROACH
Anesthesia: Spinal | Site: Hip | Laterality: Right

## 2023-01-17 MED ORDER — MIDAZOLAM HCL 2 MG/2ML IJ SOLN
INTRAMUSCULAR | Status: DC | PRN
  Administered 2023-01-17: 2 mg via INTRAVENOUS

## 2023-01-17 MED ORDER — PROPOFOL 10 MG/ML IV BOLUS
INTRAVENOUS | Status: AC
  Filled 2023-01-17: qty 20

## 2023-01-17 MED ORDER — HYDROMORPHONE HCL 1 MG/ML IJ SOLN: 0.5000 mg | INTRAMUSCULAR | Status: DC | PRN

## 2023-01-17 MED ORDER — TIZANIDINE HCL 4 MG PO TABS: 4.0000 mg | ORAL_TABLET | Freq: Four times a day (QID) | ORAL | 0 refills | Status: DC | PRN

## 2023-01-17 MED ORDER — PROPOFOL 10 MG/ML IV BOLUS
INTRAVENOUS | Status: DC | PRN
  Administered 2023-01-17 (×3): 50 mg via INTRAVENOUS

## 2023-01-17 MED ORDER — DEXAMETHASONE SODIUM PHOSPHATE 10 MG/ML IJ SOLN
INTRAMUSCULAR | Status: DC | PRN
  Administered 2023-01-17: 10 mg via INTRAVENOUS

## 2023-01-17 MED ORDER — PHENOL 1.4 % MT LIQD: 1.0000 | OROMUCOSAL | Status: DC | PRN

## 2023-01-17 MED ORDER — ONDANSETRON HCL 4 MG/2ML IJ SOLN
INTRAMUSCULAR | Status: DC | PRN
  Administered 2023-01-17: 4 mg via INTRAVENOUS

## 2023-01-17 MED ORDER — SODIUM CHLORIDE 0.9 % IR SOLN
Status: DC | PRN
  Administered 2023-01-17: 1000 mL

## 2023-01-17 MED ORDER — ONDANSETRON HCL 4 MG PO TABS: 4.0000 mg | ORAL_TABLET | Freq: Four times a day (QID) | ORAL | Status: DC | PRN

## 2023-01-17 MED ORDER — ACETAMINOPHEN 325 MG PO TABS: 325.0000 mg | ORAL_TABLET | Freq: Four times a day (QID) | ORAL | Status: DC | PRN

## 2023-01-17 MED ORDER — CHLORHEXIDINE GLUCONATE 0.12 % MT SOLN
15.0000 mL | Freq: Once | OROMUCOSAL | Status: AC
  Administered 2023-01-17: 15 mL via OROMUCOSAL
  Filled 2023-01-17: qty 15

## 2023-01-17 MED ORDER — PHENYLEPHRINE 80 MCG/ML (10ML) SYRINGE FOR IV PUSH (FOR BLOOD PRESSURE SUPPORT)
PREFILLED_SYRINGE | INTRAVENOUS | Status: DC | PRN
  Administered 2023-01-17 (×3): 80 ug via INTRAVENOUS

## 2023-01-17 MED ORDER — FENTANYL CITRATE (PF) 250 MCG/5ML IJ SOLN
INTRAMUSCULAR | Status: AC
  Filled 2023-01-17: qty 5

## 2023-01-17 MED ORDER — FENTANYL CITRATE (PF) 250 MCG/5ML IJ SOLN
INTRAMUSCULAR | Status: DC | PRN
  Administered 2023-01-17: 50 ug via INTRAVENOUS

## 2023-01-17 MED ORDER — ALUM & MAG HYDROXIDE-SIMETH 200-200-20 MG/5ML PO SUSP: 30.0000 mL | ORAL | Status: DC | PRN

## 2023-01-17 MED ORDER — ASPIRIN 81 MG PO CHEW: 81.0000 mg | CHEWABLE_TABLET | Freq: Two times a day (BID) | ORAL | Status: DC

## 2023-01-17 MED ORDER — ONDANSETRON HCL 4 MG/2ML IJ SOLN: 4.0000 mg | Freq: Four times a day (QID) | INTRAMUSCULAR | Status: DC | PRN

## 2023-01-17 MED ORDER — HYDROMORPHONE HCL 1 MG/ML IJ SOLN
0.2500 mg | INTRAMUSCULAR | Status: DC | PRN
  Administered 2023-01-17 (×2): 0.5 mg via INTRAVENOUS

## 2023-01-17 MED ORDER — SODIUM CHLORIDE 0.9 % IV SOLN: INTRAVENOUS | Status: DC

## 2023-01-17 MED ORDER — METOCLOPRAMIDE HCL 5 MG PO TABS: 5.0000 mg | ORAL_TABLET | Freq: Three times a day (TID) | ORAL | Status: DC | PRN

## 2023-01-17 MED ORDER — ASPIRIN 81 MG PO CHEW: 81.0000 mg | CHEWABLE_TABLET | Freq: Two times a day (BID) | ORAL | 0 refills | Status: DC

## 2023-01-17 MED ORDER — PANTOPRAZOLE SODIUM 40 MG PO TBEC
40.0000 mg | DELAYED_RELEASE_TABLET | Freq: Every day | ORAL | Status: DC
  Administered 2023-01-17: 40 mg via ORAL
  Filled 2023-01-17: qty 1

## 2023-01-17 MED ORDER — OXYCODONE HCL 5 MG PO TABS
5.0000 mg | ORAL_TABLET | ORAL | Status: DC | PRN
  Administered 2023-01-17: 10 mg via ORAL
  Filled 2023-01-17: qty 2

## 2023-01-17 MED ORDER — BUPIVACAINE IN DEXTROSE 0.75-8.25 % IT SOLN
INTRATHECAL | Status: DC | PRN
  Administered 2023-01-17: 1.6 mL via INTRATHECAL

## 2023-01-17 MED ORDER — POVIDONE-IODINE 10 % EX SWAB
2.0000 | Freq: Once | CUTANEOUS | Status: AC
  Administered 2023-01-17: 2 via TOPICAL

## 2023-01-17 MED ORDER — MENTHOL 3 MG MT LOZG: 1.0000 | LOZENGE | OROMUCOSAL | Status: DC | PRN

## 2023-01-17 MED ORDER — ACETAMINOPHEN 500 MG PO TABS
1000.0000 mg | ORAL_TABLET | Freq: Once | ORAL | Status: AC
  Administered 2023-01-17: 1000 mg via ORAL
  Filled 2023-01-17: qty 2

## 2023-01-17 MED ORDER — LACTATED RINGERS IV SOLN: INTRAVENOUS | Status: DC

## 2023-01-17 MED ORDER — 0.9 % SODIUM CHLORIDE (POUR BTL) OPTIME
TOPICAL | Status: DC | PRN
  Administered 2023-01-17: 1000 mL

## 2023-01-17 MED ORDER — POLYETHYLENE GLYCOL 3350 17 G PO PACK: 17.0000 g | PACK | Freq: Every day | ORAL | Status: DC | PRN

## 2023-01-17 MED ORDER — OXYCODONE HCL 5 MG PO TABS
ORAL_TABLET | ORAL | Status: AC
  Filled 2023-01-17: qty 2

## 2023-01-17 MED ORDER — DOCUSATE SODIUM 100 MG PO CAPS
100.0000 mg | ORAL_CAPSULE | Freq: Two times a day (BID) | ORAL | Status: DC
  Filled 2023-01-17: qty 1

## 2023-01-17 MED ORDER — TRANEXAMIC ACID-NACL 1000-0.7 MG/100ML-% IV SOLN
1000.0000 mg | INTRAVENOUS | Status: AC
  Administered 2023-01-17: 1000 mg via INTRAVENOUS
  Filled 2023-01-17: qty 100

## 2023-01-17 MED ORDER — PROPOFOL 500 MG/50ML IV EMUL
INTRAVENOUS | Status: DC | PRN
  Administered 2023-01-17: 50 ug/kg/min via INTRAVENOUS

## 2023-01-17 MED ORDER — DIPHENHYDRAMINE HCL 12.5 MG/5ML PO ELIX: 12.5000 mg | ORAL_SOLUTION | ORAL | Status: DC | PRN

## 2023-01-17 MED ORDER — CEFAZOLIN SODIUM-DEXTROSE 2-4 GM/100ML-% IV SOLN
2.0000 g | INTRAVENOUS | Status: AC
  Administered 2023-01-17: 2 g via INTRAVENOUS
  Filled 2023-01-17: qty 100

## 2023-01-17 MED ORDER — ORAL CARE MOUTH RINSE: 15.0000 mL | Freq: Once | OROMUCOSAL | Status: AC

## 2023-01-17 MED ORDER — METOCLOPRAMIDE HCL 5 MG/ML IJ SOLN: 5.0000 mg | Freq: Three times a day (TID) | INTRAMUSCULAR | Status: DC | PRN

## 2023-01-17 MED ORDER — PROPOFOL 1000 MG/100ML IV EMUL
INTRAVENOUS | Status: AC
  Filled 2023-01-17: qty 100

## 2023-01-17 MED ORDER — HYDROMORPHONE HCL 1 MG/ML IJ SOLN
INTRAMUSCULAR | Status: AC
  Filled 2023-01-17: qty 1

## 2023-01-17 MED ORDER — OXYCODONE HCL 5 MG PO TABS: 5.0000 mg | ORAL_TABLET | Freq: Four times a day (QID) | ORAL | 0 refills | Status: DC | PRN

## 2023-01-17 MED ORDER — METHOCARBAMOL 1000 MG/10ML IJ SOLN: 500.0000 mg | Freq: Four times a day (QID) | INTRAVENOUS | Status: DC | PRN

## 2023-01-17 MED ORDER — DEXAMETHASONE SODIUM PHOSPHATE 10 MG/ML IJ SOLN
INTRAMUSCULAR | Status: AC
  Filled 2023-01-17: qty 1

## 2023-01-17 MED ORDER — OXYCODONE HCL 5 MG PO TABS
10.0000 mg | ORAL_TABLET | ORAL | Status: DC | PRN
  Administered 2023-01-17: 10 mg via ORAL

## 2023-01-17 MED ORDER — ONDANSETRON HCL 4 MG/2ML IJ SOLN
INTRAMUSCULAR | Status: AC
  Filled 2023-01-17: qty 2

## 2023-01-17 MED ORDER — CEFAZOLIN SODIUM-DEXTROSE 1-4 GM/50ML-% IV SOLN
1.0000 g | Freq: Four times a day (QID) | INTRAVENOUS | Status: DC
  Administered 2023-01-17: 1 g via INTRAVENOUS
  Filled 2023-01-17: qty 50

## 2023-01-17 MED ORDER — PHENYLEPHRINE HCL-NACL 20-0.9 MG/250ML-% IV SOLN
INTRAVENOUS | Status: DC | PRN
  Administered 2023-01-17: 25 ug/min via INTRAVENOUS

## 2023-01-17 MED ORDER — METHOCARBAMOL 500 MG PO TABS
500.0000 mg | ORAL_TABLET | Freq: Four times a day (QID) | ORAL | Status: DC | PRN
  Administered 2023-01-17: 500 mg via ORAL
  Filled 2023-01-17: qty 1

## 2023-01-17 MED ORDER — MIDAZOLAM HCL 2 MG/2ML IJ SOLN
INTRAMUSCULAR | Status: AC
  Filled 2023-01-17: qty 2

## 2023-01-17 SURGICAL SUPPLY — 56 items
APL SKNCLS STERI-STRIP NONHPOA (GAUZE/BANDAGES/DRESSINGS) ×1
BAG COUNTER SPONGE SURGICOUNT (BAG) ×1 IMPLANT
BAG SPNG CNTER NS LX DISP (BAG) ×1
BENZOIN TINCTURE PRP APPL 2/3 (GAUZE/BANDAGES/DRESSINGS) ×1 IMPLANT
BLADE CLIPPER SURG (BLADE) IMPLANT
BLADE SAW SGTL 18X1.27X75 (BLADE) ×1 IMPLANT
COVER SURGICAL LIGHT HANDLE (MISCELLANEOUS) ×1 IMPLANT
CUP ACET PNNCL SECTR W/GRIP 56 (Hips) IMPLANT
DRAPE C-ARM 42X72 X-RAY (DRAPES) ×1 IMPLANT
DRAPE STERI IOBAN 125X83 (DRAPES) ×1 IMPLANT
DRAPE U-SHAPE 47X51 STRL (DRAPES) ×3 IMPLANT
DRSG AQUACEL AG ADV 3.5X10 (GAUZE/BANDAGES/DRESSINGS) ×1 IMPLANT
DURAPREP 26ML APPLICATOR (WOUND CARE) ×1 IMPLANT
ELECT BLADE 4.0 EZ CLEAN MEGAD (MISCELLANEOUS) ×1
ELECT BLADE 6.5 EXT (BLADE) IMPLANT
ELECT REM PT RETURN 9FT ADLT (ELECTROSURGICAL) ×1
ELECTRODE BLDE 4.0 EZ CLN MEGD (MISCELLANEOUS) ×1 IMPLANT
ELECTRODE REM PT RTRN 9FT ADLT (ELECTROSURGICAL) ×1 IMPLANT
FACESHIELD WRAPAROUND (MASK) ×2 IMPLANT
FACESHIELD WRAPAROUND OR TEAM (MASK) ×2 IMPLANT
FEM STEM 12/14 TAPER SZ 4 HIP (Orthopedic Implant) ×1 IMPLANT
FEMORAL STEM 12/14 TPR SZ4 HIP (Orthopedic Implant) IMPLANT
GLOVE BIOGEL PI IND STRL 8 (GLOVE) ×2 IMPLANT
GLOVE ECLIPSE 8.0 STRL XLNG CF (GLOVE) ×1 IMPLANT
GLOVE ORTHO TXT STRL SZ7.5 (GLOVE) ×2 IMPLANT
GOWN STRL REUS W/ TWL LRG LVL3 (GOWN DISPOSABLE) ×2 IMPLANT
GOWN STRL REUS W/ TWL XL LVL3 (GOWN DISPOSABLE) ×2 IMPLANT
GOWN STRL REUS W/TWL LRG LVL3 (GOWN DISPOSABLE) ×2
GOWN STRL REUS W/TWL XL LVL3 (GOWN DISPOSABLE) ×2
HANDPIECE INTERPULSE COAX TIP (DISPOSABLE) ×1
HEAD CERAMIC 36 PLUS5 (Hips) IMPLANT
KIT BASIN OR (CUSTOM PROCEDURE TRAY) ×1 IMPLANT
KIT TURNOVER KIT B (KITS) ×1 IMPLANT
MANIFOLD NEPTUNE II (INSTRUMENTS) ×1 IMPLANT
NS IRRIG 1000ML POUR BTL (IV SOLUTION) ×1 IMPLANT
PACK TOTAL JOINT (CUSTOM PROCEDURE TRAY) ×1 IMPLANT
PAD ARMBOARD 7.5X6 YLW CONV (MISCELLANEOUS) ×1 IMPLANT
PINN SECTOR W/GRIP ACE CUP 56 (Hips) ×1 IMPLANT
PINNACLE ALTRX PLUS 4 N 36X56 (Hips) IMPLANT
SET HNDPC FAN SPRY TIP SCT (DISPOSABLE) ×1 IMPLANT
STAPLER VISISTAT 35W (STAPLE) IMPLANT
STRIP CLOSURE SKIN 1/2X4 (GAUZE/BANDAGES/DRESSINGS) ×2 IMPLANT
SUT ETHIBOND NAB CT1 #1 30IN (SUTURE) ×1 IMPLANT
SUT MNCRL AB 4-0 PS2 18 (SUTURE) IMPLANT
SUT VIC AB 0 CT1 27 (SUTURE) ×2
SUT VIC AB 0 CT1 27XBRD ANBCTR (SUTURE) ×1 IMPLANT
SUT VIC AB 1 CT1 27 (SUTURE) ×2
SUT VIC AB 1 CT1 27XBRD ANBCTR (SUTURE) ×1 IMPLANT
SUT VIC AB 2-0 CT1 27 (SUTURE) ×2
SUT VIC AB 2-0 CT1 TAPERPNT 27 (SUTURE) ×1 IMPLANT
TOWEL GREEN STERILE (TOWEL DISPOSABLE) ×1 IMPLANT
TOWEL GREEN STERILE FF (TOWEL DISPOSABLE) ×1 IMPLANT
TRAY CATH INTERMITTENT SS 16FR (CATHETERS) IMPLANT
TRAY FOLEY W/BAG SLVR 16FR (SET/KITS/TRAYS/PACK)
TRAY FOLEY W/BAG SLVR 16FR ST (SET/KITS/TRAYS/PACK) IMPLANT
WATER STERILE IRR 1000ML POUR (IV SOLUTION) ×2 IMPLANT

## 2023-01-17 NOTE — Progress Notes (Signed)
Patient ID: Joel [name], male   DOB: 1971/05/25, 52 y.o.   MRN: [id number] The patient has done quite well postoperatively with his mobility and therapy.  He has good pain control and his vital signs are stable.  His right operative hip is stable.  He would like to go ahead and be discharged home today which I think is reasonable considering how well he is doing.

## 2023-01-17 NOTE — Plan of Care (Signed)
Pt doing well Pt given D/C instructions with verbal understanding. Rx's were sent to the pharmacy by MD. Pt's incision is clean and dry with no sign of infection. Pt received RW and 3-n-1 from [employer] per MD order. Pt's IV was removed prior to D/C. Pt D/C'd home via wheelchair per MD order. Pt is stable @ D/C and has no other needs at this time. Holli Humbles, RN

## 2023-01-17 NOTE — Discharge Summary (Signed)
Patient ID: LA CABREROS MRN: [id number] DOB/AGE: 1970/12/01 52 y.o.  Admit date: 01/17/2023 Discharge date: 01/17/2023  Admission Diagnoses:  Principal Problem:   Unilateral primary osteoarthritis, right hip Active Problems:   Status post total replacement of right hip   Discharge Diagnoses:  Same  Past Medical History:  Diagnosis Date   Arthritis    GERD (gastroesophageal reflux disease)    takes Prilosec bid   High cholesterol    History of kidney stones    Infection of index finger    dog bite    Surgeries: Procedure(s): RIGHT TOTAL HIP ARTHROPLASTY ANTERIOR APPROACH on 01/17/2023   Consultants:   Discharged Condition: Improved  Hospital Course: Joel [name] is an 52 y.o. male who was admitted 01/17/2023 for operative treatment ofUnilateral primary osteoarthritis, right hip. Patient has severe unremitting pain that affects sleep, daily activities, and work/hobbies. After pre-op clearance the patient was taken to the operating room on 01/17/2023 and underwent  Procedure(s): RIGHT TOTAL HIP ARTHROPLASTY ANTERIOR APPROACH.    Patient was given perioperative antibiotics:  Anti-infectives (From admission, onward)    Start     Dose/Rate Route Frequency Ordered Stop   01/17/23 1330  ceFAZolin (ANCEF) IVPB 1 g/50 mL premix        1 g 100 mL/hr over 30 Minutes Intravenous Every 6 hours 01/17/23 1030 01/18/23 0129   01/17/23 0600  ceFAZolin (ANCEF) IVPB 2g/100 mL premix        2 g 200 mL/hr over 30 Minutes Intravenous On call to O.R. 01/17/23 0555 01/17/23 0800        Patient was given sequential compression devices, early ambulation, and chemoprophylaxis to prevent DVT.  Patient benefited maximally from hospital stay and there were no complications.    Recent vital signs: Patient Vitals for the past 24 hrs:  BP Temp Temp src Pulse Resp SpO2 Height Weight  01/17/23 1026 (!) 148/94 -- -- 75 18 97 % -- --  01/17/23 0930 119/87 -- -- 69 16 92 % -- --   01/17/23 0915 120/85 98 F (36.7 C) -- 68 17 96 % -- --  01/17/23 0605 (!) 154/94 99.2 F (37.3 C) Oral 76 18 -- 5\' 11"  (1.803 m) 106.6 kg     Recent laboratory studies: No results for input(s): "WBC", "HGB", "HCT", "PLT", "NA", "K", "CL", "CO2", "BUN", "CREATININE", "GLUCOSE", "INR", "CALCIUM" in the last 72 hours.  Invalid input(s): "PT", "2"   Discharge Medications:   Allergies as of 01/17/2023   No Known Allergies      Medication List     STOP taking these medications    ibuprofen 800 MG tablet Commonly known as: ADVIL       TAKE these medications    aspirin 81 MG chewable tablet Chew 1 tablet (81 mg total) by mouth 2 (two) times daily.   esomeprazole 20 MG capsule Commonly known as: NEXIUM Take 20 mg by mouth daily at 12 noon.   ICY HOT EX Apply 1 Application topically daily as needed (pain).   meloxicam 15 MG tablet Commonly known as: MOBIC TAKE 1 TABLET (15 MG TOTAL) BY MOUTH DAILY.   MULTIVITAMIN GUMMIES ADULT PO Take 2 tablets by mouth daily.   oxyCODONE 5 MG immediate release tablet Commonly known as: Oxy IR/ROXICODONE Take 1-2 tablets (5-10 mg total) by mouth every 6 (six) hours as needed for moderate pain (pain score 4-6).   rosuvastatin 20 MG tablet Commonly known as: Crestor Take 1 tablet (20 mg total) by mouth  daily.   tiZANidine 4 MG tablet Commonly known as: Zanaflex Take 1 tablet (4 mg total) by mouth every 6 (six) hours as needed for muscle spasms.               Durable Medical Equipment  (From admission, onward)           Start     Ordered   01/17/23 1031  DME 3 n 1  Once        01/17/23 1030   01/17/23 1031  DME Walker rolling  Once       Question Answer Comment  Walker: With 5 Inch Wheels   Patient needs a walker to treat with the following condition Status post total replacement of right hip      01/17/23 1030            Diagnostic Studies: DG Pelvis Portable  Result Date: 01/17/2023 CLINICAL DATA:   Follow-up right hip replacement EXAM: PORTABLE PELVIS 1-2 VIEWS COMPARISON:  01/17/2023 FINDINGS: Total hip arthroplasty on the right. Components appear well positioned. No radiographically detectable complication. IMPRESSION: Good appearance following total hip arthroplasty on the right. Electronically Signed   By: Nelson Chimes M.D.   On: 01/17/2023 09:48   DG HIP UNILAT WITH PELVIS 1V RIGHT  Result Date: 01/17/2023 CLINICAL DATA:  Right total hip arthroplasty. EXAM: DG HIP (WITH OR WITHOUT PELVIS) 1V RIGHT COMPARISON:  Radiographs 08/30/2022. FINDINGS: C-arm fluoroscopy was provided in the operating room without the presence of a radiologist.21 seconds fluoroscopy time. 3.79 mGy air kerma. Three C-arm fluoroscopic images were obtained intraoperatively and are submitted for post operative interpretation. These images demonstrate interval right total hip arthroplasty without apparent complication. There is air within the hip joint and surrounding soft tissues. Please see intraoperative findings for further detail. IMPRESSION: Intraoperative fluoroscopic guidance for right total hip arthroplasty. Electronically Signed   By: Richardean Sale M.D.   On: 01/17/2023 08:59   DG C-Arm 1-60 Min-No Report  Result Date: 01/17/2023 Fluoroscopy was utilized by the requesting physician.  No radiographic interpretation.    Disposition: Discharge disposition: 01-Home or Self Care          Follow-up Information     [facility], [facility] Follow up.   Specialty: [place] Why: The home health agency will contact you for the first home visit Contact information: [address] [place] [address] [phone]                  Signed: HARVIN MCCULLY 01/17/2023, 4:03 PM

## 2023-01-17 NOTE — Op Note (Signed)
Operative Note  Date of operation: 01/17/2023 Preoperative diagnosis: Right hip primary osteoarthritis Postoperative diagnosis: Same  Procedure: Right direct anterior total hip arthroplasty  Implants: Implant Name Type Inv. Item Serial No. Manufacturer Lot No. LRB No. Used Action  PINN SECTOR W/GRIP ACE CUP 56 - [id number] Hips PINN SECTOR W/GRIP ACE CUP 56  [employer] [id number] Right 1 Implanted  PINNACLE ALTRX PLUS 4 N 36X56 - [id number] Hips PINNACLE ALTRX PLUS 4 N 36X56  [employer]TH[employer] [id number] Right 1 Implanted  FEM STEM 12/14 TAPER SZ 4 HIP - [id number] Orthopedic Implant FEM STEM 12/14 TAPER SZ 4 HIP  [employer]TH[employer] [id number] Right 1 Implanted  HEAD CERAMIC 36 PLUS5 - [id number] Hips HEAD CERAMIC 36 PLUS5  [employer]TH[employer] [id number] Right 1 Implanted   Surgeon: Lind Guest. Ninfa Linden, MD Assistant: Benita Stabile, PA-C  Anesthesia: Spinal EBL: 200 to 250 cc Antibiotics: IV Ancef Complications: None  Indications: Joel [name] is a 52 year old gentleman who is very active.  He unfortunately has severe arthritis of his right hip and at this point it is detrimentally affecting his mobility, his quality of life, and his activities of daily living.  His x-rays show bone-on-bone wear that hip and he has tried and failed conservative treatment for over 12 months now.  At this point he does wish to proceed with a total hip arthroplasty and we agree with this as well based on his clinical exam findings and x-ray findings combined with the failure of conservative treatment.  We talked in length in detail about the risk of acute blood loss anemia, nerve or vessel injury, fracture, infection, DVT, implant failure, dislocation, leg length differences and wound healing issues.  We spoke about her goals being decreased pain, improve mobility and improve quality of life.  Procedure description: After informed consent was obtained and the appropriate right hip was marked, the patient was brought to  the operating room and set up on the stretcher where spinal anesthesia was obtained.  He was then laid in supine position on the stretcher and a Foley catheter was placed.  We assessed his leg lengths and he is definitely shorter on his right side than the left.  We placed him supine on the Hana fracture table and a perineal wrist was placed between his legs and both legs were in line skeletal traction devices but no traction applied.  We then assessed his right hip and pelvis radiographically.  The right hip was prepped and draped with DuraPrep and sterile drapes.  A timeout was called and he was identified as the correct patient and the correct right hip.  An incision was then made just inferior and posterior to the ASIS and carried slightly obliquely down the leg.  Dissection was carried down to the tensor fascia lata muscle and the tensor fascia was then divided longitudinally to proceed with a direct interposed the hip.  Circumflex vessels were identified and cauterized.  The hip capsule was identified and opened up in L-type format finding a moderate joint effusion.  Cobra retractors were placed around the medial and lateral femoral neck and a femoral neck cut was made with an oscillating saw just proximal to the lesser trochanter.  This Was completed with an osteotome and a corkscrew guide was placed in the femoral head.  The femoral head was removed in its entirety and there was a wide area devoid of cartilage.  A bent Hohmann was then placed over the medial acetabular rim and remnants of the acetabular labrum and other  debris were removed.  Reaming was then initiated under direct visualization and stepwise increments from a size 43 reamer going up to a size 55 reamer with all reamers again placed under direct visualization and the last reamer placed under direct fluoroscopy as well in order to obtain the depth of reaming, the inclination and anteversion.  The real DePuy sector [place] acetabular clinic  size 56 was then placed without difficulty followed by 36+4 polythene liner based off the patient's offset.  Attention was then turned to the femur.  With the right leg externally rotated to 120 degrees, extended and adducted, a Mueller retractor was placed by the medial calcar and a Hohmann retractor was placed behind the greater trochanter.  The lateral joint capsule was released and a box cutting osteotome was used into the femoral canal.  Broaching was then initiated from a size 0 broach and stepwise increments going to a size 4 broach.  We then trialed a standard offset femoral neck and a 36+1.5 trial hip ball.  This was reduced in the pelvis we assessed the leg lengths and offset radiographically as well as mechanically and we felt like we needed to increase his leg lengths a little bit.  We dislocated the hip and remove the trial components.  We placed the real DePuy Actis femoral component size 4 with high offset and the real 36+5 ceramic hip ball.  This again was reduced into the pelvis and we are pleased with leg length, offset, range of motion and stability assessed both radiographically and clinically.  The soft tissue was then irrigated with normal saline solution.  Remnants of the joint capsule was closed with interrupted #1 Ethibond suture followed by #1 Vicryl close the tensor fascia.  0 Vicryl was used to close the deep tissue and 2-0 Vicryl was used to close the subcutaneous tissue.  The skin was closed with staples.  An Aquacel dressing was placed.  The patient was taken off the Hana table and taken recovery room in stable addition.  Benita Stabile, PA-C did assist during the entire case and beginning to end and his assistance was medically necessary and crucial for soft tissue retraction and management, helping guide implant placement and a layered closure of the wound.

## 2023-01-17 NOTE — Evaluation (Signed)
Physical Therapy Evaluation Patient Details Name: Joel [name] MRN: [id number] DOB: 1970/11/12 Today's Date: 01/17/2023  History of Present Illness  52 y.o. male presents to [facility] hospital on [facility]/17/2023 for elective R THA. PMH includes GERD, HTN, anxiety, chronic L ankle pain.  Clinical Impression  Pt presents to PT after R direct anterior THA. Pt is mobilizing very well at this time, ambulating with RW at a modI level. Pt is able to ascend/descend one step multiple times with good stability. PT provides education on HEP to improve strength and ROM. Pt is eager to go home and is able to perform all mobility required in the home setting at this time. PT will follow up if the pt remains admitted overnight.      Recommendations for follow up therapy are one component of a multi-disciplinary discharge planning process, led by the attending physician.  Recommendations may be updated based on patient status, additional functional criteria and insurance authorization.  Follow Up Recommendations Follow physician's recommendations for discharge plan and follow up therapies      Assistance Recommended at Discharge PRN  Patient can return home with the following  A little help with bathing/dressing/bathroom;Assistance with cooking/housework;Assist for transportation    Equipment Recommendations Rolling walker (2 wheels)  Recommendations for Other Services       Functional Status Assessment Patient has had a recent decline in their functional status and demonstrates the ability to make significant improvements in function in a reasonable and predictable amount of time.     Precautions / Restrictions Precautions Precautions: Fall;Other (comment) Precaution Comments: direct anterior THA Restrictions Weight Bearing Restrictions: Yes RLE Weight Bearing: Weight bearing as tolerated      Mobility  Bed Mobility Overal bed mobility: Independent                  Transfers Overall  transfer level: Modified independent Equipment used: None                    Ambulation/Gait Ambulation/Gait assistance: Modified independent (Device/Increase time) Gait Distance (Feet): 120 Feet Assistive device: Rolling walker (2 wheels) Gait Pattern/deviations: Step-through pattern Gait velocity: functional Gait velocity interpretation: 1.31 - 2.62 ft/sec, indicative of limited community ambulator   General Gait Details: slowed step-through gait, pt lifint walker off floor during turns and for multiple feet when ambulating, demonstrates good R hip strength and stability at this time. PT encourages continued use of RW at this time to reduce potential falls risk  Stairs Stairs: Yes Stairs assistance: Supervision Stair Management: No rails, Step to pattern, Forwards Number of Stairs: 1 General stair comments: one step x 2 trials, verbal cues for stair technique leading with LLE when ascending  Wheelchair Mobility    Modified Rankin (Stroke Patients Only)       Balance Overall balance assessment: Modified Independent                                           Pertinent Vitals/Pain Pain Assessment Pain Assessment: Faces Faces Pain Scale: Hurts little more Pain Location: R hip Pain Descriptors / Indicators: Sore Pain Intervention(s): Monitored during session    Home Living Family/patient expects to be discharged to:: Private residence Living Arrangements: Alone Available Help at Discharge: Family;Available 24 hours/day Type of Home: House Home Access: Stairs to enter Entrance Stairs-Rails: None Entrance Stairs-Number of Steps: 1   Home Layout: One  level Home Equipment: None      Prior Function Prior Level of Function : Independent/Modified Independent;Working/employed;Driving                     Hand Dominance        Extremity/Trunk Assessment   Upper Extremity Assessment Upper Extremity Assessment: Overall WFL for tasks  assessed    Lower Extremity Assessment Lower Extremity Assessment: RLE deficits/detail RLE Deficits / Details: ROM WFL, strength at least 4-/5 based on observation, not formally MMT    Cervical / Trunk Assessment Cervical / Trunk Assessment: Normal  Communication   Communication: No difficulties  Cognition Arousal/Alertness: Awake/alert Behavior During Therapy: WFL for tasks assessed/performed Overall Cognitive Status: Within Functional Limits for tasks assessed                                          General Comments General comments (skin integrity, edema, etc.): VSS on RA    Exercises Other Exercises Other Exercises: PT provides education on surgical hip HEP handout   Assessment/Plan    PT Assessment Patient needs continued PT services  PT Problem List Decreased strength;Decreased activity tolerance;Decreased balance;Decreased mobility;Decreased knowledge of use of DME;Pain       PT Treatment Interventions DME instruction;Gait training;Stair training;Functional mobility training;Balance training;Neuromuscular re-education;Patient/family education;Therapeutic exercise;Therapeutic activities    PT Goals (Current goals can be found in the Care Plan section)  Acute Rehab PT Goals Patient Stated Goal: to go home PT Goal Formulation: With patient Time For Goal Achievement: 01/21/23 Potential to Achieve Goals: Good    Frequency 7X/week     Co-evaluation               AM-PAC PT "6 Clicks" Mobility  Outcome Measure Help needed turning from your back to your side while in a flat bed without using bedrails?: None Help needed moving from lying on your back to sitting on the side of a flat bed without using bedrails?: None Help needed moving to and from a bed to a chair (including a wheelchair)?: None Help needed standing up from a chair using your arms (e.g., wheelchair or bedside chair)?: None Help needed to walk in hospital room?: None Help  needed climbing 3-5 steps with a railing? : A Little 6 Click Score: 23    End of Session   Activity Tolerance: Patient tolerated treatment well Patient left: in bed;with call bell/phone within reach Nurse Communication: Mobility status PT Visit Diagnosis: Other abnormalities of gait and mobility (R26.89);Pain Pain - Right/Left: Right Pain - part of body: Hip    Time: 1240-1255 PT Time Calculation (min) (ACUTE ONLY): 15 min   Charges:   PT Evaluation $PT Eval Low Complexity: 1 Low          Zenaida Niece, PT, DPT Acute Rehabilitation Office [phone]   Zenaida Niece 01/17/2023, 1:10 PM

## 2023-01-17 NOTE — Transfer of Care (Signed)
Immediate Anesthesia Transfer of Care Note  Patient: Joel [name]  Procedure(s) Performed: RIGHT TOTAL HIP ARTHROPLASTY ANTERIOR APPROACH (Right: Hip)  Patient Location: PACU  Anesthesia Type:Spinal  Level of Consciousness: awake, alert , and oriented  Airway & Oxygen Therapy: Patient Spontanous Breathing and Patient connected to face mask oxygen  Post-op Assessment: Report given to RN and Post -op Vital signs reviewed and stable  Post vital signs: Reviewed and stable  Last Vitals:  Vitals Value Taken Time  BP    Temp    Pulse 68 01/17/23 0915  Resp 17 01/17/23 0915  SpO2 96 % 01/17/23 0915  Vitals shown include unvalidated device data.  Last Pain:  Vitals:   01/17/23 0616  TempSrc:   PainSc: 7       Patients Stated Pain Goal: 3 ([id number])  Complications: No notable events documented.

## 2023-01-17 NOTE — Interval H&P Note (Signed)
History and Physical Interval Note: The patient understands that he is here today for a right total hip replacement to treat his severe right hip arthritis.  There has been no acute or interval change in his medical status.  See H&P.  The risks and benefits of surgery been discussed in detail and informed consent is obtained.  The right operative hip has been marked.  01/17/2023 7:24 AM  Joel [name]  has presented today for surgery, with the diagnosis of OSTEOARTHRITIS RIGHT HIP.  The various methods of treatment have been discussed with the patient and family. After consideration of risks, benefits and other options for treatment, the patient has consented to  Procedure(s): RIGHT TOTAL HIP ARTHROPLASTY ANTERIOR APPROACH (Right) as a surgical intervention.  The patient's history has been reviewed, patient examined, no change in status, stable for surgery.  I have reviewed the patient's chart and labs.  Questions were answered to the patient's satisfaction.     Mcarthur Rossetti

## 2023-01-17 NOTE — Anesthesia Procedure Notes (Signed)
Date/Time: 01/17/2023 7:32 AM  Performed by: Michele Rockers, CRNAPre-anesthesia Checklist: Patient identified, Emergency Drugs available, Suction available, Timeout performed and Patient being monitored Patient Re-evaluated:Patient Re-evaluated prior to induction Oxygen Delivery Method: Simple face mask

## 2023-01-17 NOTE — Anesthesia Postprocedure Evaluation (Signed)
Anesthesia Post Note  Patient: ERIN KELLIE  Procedure(s) Performed: RIGHT TOTAL HIP ARTHROPLASTY ANTERIOR APPROACH (Right: Hip)     Patient location during evaluation: PACU Anesthesia Type: Spinal Level of consciousness: oriented and awake and alert Pain management: pain level controlled Vital Signs Assessment: post-procedure vital signs reviewed and stable Respiratory status: spontaneous breathing and respiratory function stable Cardiovascular status: blood pressure returned to baseline and stable Postop Assessment: no headache, no backache, no apparent nausea or vomiting, spinal receding and patient able to bend at knees Anesthetic complications: no  No notable events documented.  Last Vitals:  Vitals:   01/17/23 0930 01/17/23 1026  BP: 119/87 (!) 148/94  Pulse: 69 75  Resp: 16 18  Temp:    SpO2: 92% 97%    Last Pain:  Vitals:   01/17/23 1030  TempSrc:   PainSc: 2                  Koralee Wedeking,W. EDMOND

## 2023-01-17 NOTE — Anesthesia Procedure Notes (Signed)
Spinal  Patient location during procedure: OR Start time: 01/17/2023 7:38 AM End time: 01/17/2023 7:43 AM Reason for block: surgical anesthesia Staffing Performed: anesthesiologist  Anesthesiologist: Roderic Palau, MD Performed by: Roderic Palau, MD Authorized by: Roderic Palau, MD   Preanesthetic Checklist Completed: patient identified, IV checked, risks and benefits discussed, surgical consent, monitors and equipment checked, pre-op evaluation and timeout performed Spinal Block Patient position: sitting Prep: DuraPrep Patient monitoring: cardiac monitor, continuous pulse ox and blood pressure Approach: midline Location: L3-4 Injection technique: single-shot Needle Needle type: Pencan  Needle gauge: 24 G Needle length: 9 cm Assessment Sensory level: T8 Events: CSF return Additional Notes Functioning IV was confirmed and monitors were applied. Sterile prep and drape, including hand hygiene and sterile gloves were used. The patient was positioned and the spine was prepped. The skin was anesthetized with lidocaine.  Free flow of clear CSF was obtained prior to injecting local anesthetic into the CSF.  The spinal needle aspirated freely following injection.  The needle was carefully withdrawn.  The patient tolerated the procedure well.

## 2023-01-18 ENCOUNTER — Encounter (HOSPITAL_COMMUNITY): Payer: Self-pay | Admitting: Orthopaedic Surgery

## 2023-01-18 ENCOUNTER — Telehealth: Payer: Self-pay

## 2023-01-18 NOTE — Transitions of Care (Post Inpatient/ED Visit) (Signed)
   01/18/2023  Name: Joel [name] MRN: [id number] DOB: [birth year]  Today's TOC FU Call Status: Today's TOC FU Call Status:: Successful TOC FU Call Competed TOC FU Call Complete Date: 01/18/23  Transition Care Management Follow-up Telephone Call Date of Discharge: 01/17/23 Discharge Facility: [facility]) Type of Discharge: Inpatient Admission Primary Inpatient Discharge Diagnosis:: Unilateral primary osteoarthritis, right hip How have you been since you were released from the hospital?: Better Any questions or concerns?: No  Items Reviewed: Did you receive and understand the discharge instructions provided?: Yes Medications obtained and verified?: Yes (Medications Reviewed) Any new allergies since your discharge?: No Dietary orders reviewed?: Yes Do you have support at home?: Yes People in Home: significant other  Home Care and Equipment/Supplies: [place] Ordered?: Yes Name of [place]:: [facility] Has Agency set up a time to come to your home?: Yes [place] Visit Date: 01/20/23 Any new equipment or medical supplies ordered?: Yes Name of Medical supply agency?: [facility] Were you able to get the equipment/medical supplies?: Yes Do you have any questions related to the use of the equipment/supplies?: No  Functional Questionnaire: Do you need assistance with bathing/showering or dressing?: No Do you need assistance with meal preparation?: No Do you need assistance with eating?: No Do you have difficulty maintaining continence: No Do you need assistance with getting out of bed/getting out of a chair/moving?: No Do you have difficulty managing or taking your medications?: No  Follow up appointments reviewed: PCP Follow-up appointment confirmed?: No (specialist) MD Provider Line Number:[phone] Given: Yes [facility] Follow-up appointment confirmed?: Yes Date of Specialist follow-up appointment?: 01/30/23 Follow-Up  Specialty Provider:: Dr. Ninfa Linden Do you need transportation to your follow-up appointment?: No Do you understand care options if your condition(s) worsen?: Yes-patient verbalized understanding    SIGNATURE. Juanda Crumble LPN [place] Advisor Direct Dial [phone]

## 2023-01-20 ENCOUNTER — Telehealth: Payer: Self-pay | Admitting: Orthopaedic Surgery

## 2023-01-20 NOTE — Telephone Encounter (Signed)
Called and sw Amy advised ok for HHPT as below pt is s/p a right THA. 01/17/2023

## 2023-01-20 NOTE — Telephone Encounter (Signed)
Received call from Amy-(PT) with [facility] needing verbal orders for HHPT 1 wk 4. Amy said she saw patient today and patient declined medication Meloxicam and Rosuvastatin. Amy said patient does not like taking medication.  The number to contact Amy is [phone]

## 2023-01-23 ENCOUNTER — Other Ambulatory Visit: Payer: Self-pay | Admitting: Orthopaedic Surgery

## 2023-01-23 ENCOUNTER — Telehealth: Payer: Self-pay | Admitting: Orthopaedic Surgery

## 2023-01-23 ENCOUNTER — Other Ambulatory Visit: Payer: Self-pay | Admitting: Family Medicine

## 2023-01-23 DIAGNOSIS — M1611 Unilateral primary osteoarthritis, right hip: Secondary | ICD-10-CM

## 2023-01-23 MED ORDER — OXYCODONE HCL 5 MG PO TABS: 5.0000 mg | ORAL_TABLET | Freq: Four times a day (QID) | ORAL | 0 refills | Status: DC | PRN

## 2023-01-23 NOTE — Telephone Encounter (Signed)
Patient called needing Rx refilled Oxycodone. The number to contact patient is [phone]

## 2023-01-23 NOTE — Telephone Encounter (Signed)
Chart supports rx. Last OV: 11/28/2022   

## 2023-01-30 ENCOUNTER — Encounter: Payer: Self-pay | Admitting: Orthopaedic Surgery

## 2023-01-30 ENCOUNTER — Ambulatory Visit (INDEPENDENT_AMBULATORY_CARE_PROVIDER_SITE_OTHER): Payer: BC Managed Care – PPO | Admitting: Orthopaedic Surgery

## 2023-01-30 DIAGNOSIS — Z96641 Presence of right artificial hip joint: Secondary | ICD-10-CM

## 2023-01-30 MED ORDER — OXYCODONE HCL 5 MG PO TABS: 5.0000 mg | ORAL_TABLET | Freq: Four times a day (QID) | ORAL | 0 refills | Status: DC | PRN

## 2023-01-30 NOTE — Progress Notes (Signed)
The patient is here for his first postoperative visit 2 weeks status post a right total hip replacement.  He is doing well overall.  He is walking with a walking stick.  He has been compliant with a baby aspirin twice a day.  His right hip incision looks good.  The staples are removed and Steri-Strips applied.  There is some swelling to be expected.  His calf is soft.  He is also experiencing some right knee pain but that is from the surgery itself in terms of how we performed anterior hip surgery and he understands this as well.  He does need a refill of pain medicine so I will send some in.  He will continue to slowly increase his activities as comfort allows.  I will see him back in 4 weeks to see how he is doing overall but no x-rays are needed.

## 2023-02-03 ENCOUNTER — Other Ambulatory Visit: Payer: Self-pay | Admitting: Orthopaedic Surgery

## 2023-02-03 ENCOUNTER — Telehealth: Payer: Self-pay | Admitting: Orthopaedic Surgery

## 2023-02-03 MED ORDER — OXYCODONE HCL 5 MG PO TABS: 5.0000 mg | ORAL_TABLET | Freq: Four times a day (QID) | ORAL | 0 refills | Status: DC | PRN

## 2023-02-03 MED ORDER — TIZANIDINE HCL 4 MG PO TABS: 4.0000 mg | ORAL_TABLET | Freq: Four times a day (QID) | ORAL | 0 refills | Status: DC | PRN

## 2023-02-03 NOTE — Telephone Encounter (Signed)
Patient asking for refill on his Oxycodone 5mg  , [facility] [place]

## 2023-02-08 ENCOUNTER — Other Ambulatory Visit: Payer: Self-pay | Admitting: Orthopaedic Surgery

## 2023-02-08 ENCOUNTER — Telehealth: Payer: Self-pay | Admitting: Orthopaedic Surgery

## 2023-02-08 MED ORDER — OXYCODONE HCL 5 MG PO TABS: 5.0000 mg | ORAL_TABLET | Freq: Four times a day (QID) | ORAL | 0 refills | Status: DC | PRN

## 2023-02-08 NOTE — Telephone Encounter (Signed)
Patient asking for a refill on his Oxycodone 5mg  / [facility] [address] in [place]

## 2023-02-13 ENCOUNTER — Telehealth: Payer: Self-pay | Admitting: Orthopaedic Surgery

## 2023-02-13 ENCOUNTER — Other Ambulatory Visit: Payer: Self-pay | Admitting: Orthopaedic Surgery

## 2023-02-13 MED ORDER — OXYCODONE HCL 5 MG PO TABS: 5.0000 mg | ORAL_TABLET | Freq: Four times a day (QID) | ORAL | 0 refills | Status: DC | PRN

## 2023-02-13 NOTE — Telephone Encounter (Signed)
Patient asking for a refill on his oxycodone 5 mg. Please advise

## 2023-02-18 ENCOUNTER — Other Ambulatory Visit: Payer: Self-pay | Admitting: Family Medicine

## 2023-02-18 DIAGNOSIS — M1611 Unilateral primary osteoarthritis, right hip: Secondary | ICD-10-CM

## 2023-02-20 ENCOUNTER — Telehealth: Payer: Self-pay | Admitting: Orthopaedic Surgery

## 2023-02-20 ENCOUNTER — Other Ambulatory Visit: Payer: Self-pay | Admitting: Orthopaedic Surgery

## 2023-02-20 MED ORDER — OXYCODONE HCL 5 MG PO TABS: 5.0000 mg | ORAL_TABLET | Freq: Four times a day (QID) | ORAL | 0 refills | Status: DC | PRN

## 2023-02-20 NOTE — Telephone Encounter (Signed)
Patient called needing Rx refilled Oxycodone. The number to contact patient is [phone] 

## 2023-02-20 NOTE — Telephone Encounter (Signed)
Patient aware of the below message from [place]  

## 2023-02-25 ENCOUNTER — Other Ambulatory Visit: Payer: Self-pay | Admitting: Family Medicine

## 2023-02-25 DIAGNOSIS — K219 Gastro-esophageal reflux disease without esophagitis: Secondary | ICD-10-CM

## 2023-03-01 ENCOUNTER — Ambulatory Visit (INDEPENDENT_AMBULATORY_CARE_PROVIDER_SITE_OTHER): Payer: BC Managed Care – PPO | Admitting: Orthopaedic Surgery

## 2023-03-01 DIAGNOSIS — Z96641 Presence of right artificial hip joint: Secondary | ICD-10-CM

## 2023-03-01 MED ORDER — HYDROCODONE-ACETAMINOPHEN 5-325 MG PO TABS: 1.0000 | ORAL_TABLET | Freq: Four times a day (QID) | ORAL | 0 refills | Status: DC | PRN

## 2023-03-01 NOTE — Progress Notes (Signed)
The patient is now 6 weeks status post a right total hip arthroplasty.  He works as a Psychologist, occupational.  He is doing well overall.  We talked today about transitioning him down from oxycodone to hydrocodone and starting the process of weaning from pain medicines.  He is walking without assistive ice and doing well overall.  He is able to squat down and we know his job as a Psychologist, occupational requires that.  He is scheduled to go back to work in 2 weeks and I want him to call us in 2 weeks to see if he is ready to go back.  If he is, we can give him an appropriate work note for resuming work activities with no restrictions.  From my standpoint, we will see him back in 4 weeks to make sure he is doing well.  If he looks good at that visit we will not need to see him back for 6 months.  I will send in some hydrocodone today.  Again I encouraged him about weaning from narcotics.

## 2023-03-05 ENCOUNTER — Other Ambulatory Visit: Payer: Self-pay | Admitting: Family Medicine

## 2023-03-05 DIAGNOSIS — K219 Gastro-esophageal reflux disease without esophagitis: Secondary | ICD-10-CM

## 2023-03-09 ENCOUNTER — Telehealth: Payer: Self-pay | Admitting: Orthopaedic Surgery

## 2023-03-09 ENCOUNTER — Other Ambulatory Visit: Payer: Self-pay | Admitting: Orthopaedic Surgery

## 2023-03-09 MED ORDER — HYDROCODONE-ACETAMINOPHEN 5-325 MG PO TABS: 1.0000 | ORAL_TABLET | Freq: Three times a day (TID) | ORAL | 0 refills | Status: DC | PRN

## 2023-03-09 NOTE — Telephone Encounter (Signed)
Patient would [facility] please advise

## 2023-03-17 ENCOUNTER — Other Ambulatory Visit: Payer: Self-pay | Admitting: Orthopaedic Surgery

## 2023-03-17 ENCOUNTER — Telehealth: Payer: Self-pay | Admitting: Orthopaedic Surgery

## 2023-03-17 MED ORDER — HYDROCODONE-ACETAMINOPHEN 5-325 MG PO TABS: 1.0000 | ORAL_TABLET | Freq: Three times a day (TID) | ORAL | 0 refills | Status: DC | PRN

## 2023-03-17 NOTE — Telephone Encounter (Signed)
Hydrocodone 5/325 mg refilled please advise

## 2023-03-18 ENCOUNTER — Other Ambulatory Visit: Payer: Self-pay | Admitting: Family Medicine

## 2023-03-18 DIAGNOSIS — M1611 Unilateral primary osteoarthritis, right hip: Secondary | ICD-10-CM

## 2023-03-20 NOTE — Telephone Encounter (Signed)
Chart supports rx. Last OV: 11/28/2022   

## 2023-03-21 ENCOUNTER — Telehealth: Payer: Self-pay | Admitting: Orthopaedic Surgery

## 2023-03-21 NOTE — Telephone Encounter (Signed)
[place] forms received. To [employer]. 

## 2023-03-28 ENCOUNTER — Other Ambulatory Visit: Payer: Self-pay | Admitting: Orthopaedic Surgery

## 2023-03-28 ENCOUNTER — Telehealth: Payer: Self-pay | Admitting: Orthopaedic Surgery

## 2023-03-28 MED ORDER — HYDROCODONE-ACETAMINOPHEN 5-325 MG PO TABS: 1.0000 | ORAL_TABLET | Freq: Three times a day (TID) | ORAL | 0 refills | Status: DC | PRN

## 2023-03-28 NOTE — Telephone Encounter (Signed)
Patient called. Would like hydrocodone called in for his pain. His cb# is [phone]

## 2023-03-30 ENCOUNTER — Telehealth: Payer: Self-pay | Admitting: Orthopaedic Surgery

## 2023-03-30 NOTE — Telephone Encounter (Signed)
Patient asking to get a letter to return to work on June the 6th,please advise. When done

## 2023-03-30 NOTE — Telephone Encounter (Signed)
Patient aware this is ready  

## 2023-04-04 ENCOUNTER — Telehealth: Payer: Self-pay | Admitting: Orthopaedic Surgery

## 2023-04-04 ENCOUNTER — Other Ambulatory Visit: Payer: Self-pay | Admitting: Orthopaedic Surgery

## 2023-04-04 MED ORDER — HYDROCODONE-ACETAMINOPHEN 5-325 MG PO TABS: 1.0000 | ORAL_TABLET | Freq: Three times a day (TID) | ORAL | 0 refills | Status: DC | PRN

## 2023-04-04 NOTE — Telephone Encounter (Signed)
Patient called needing Rx refilled Hydrocodone. The number to contact patient is [phone]

## 2023-04-05 ENCOUNTER — Other Ambulatory Visit: Payer: Self-pay | Admitting: Family Medicine

## 2023-04-05 ENCOUNTER — Other Ambulatory Visit: Payer: Self-pay | Admitting: Physical Medicine and Rehabilitation

## 2023-04-05 DIAGNOSIS — K219 Gastro-esophageal reflux disease without esophagitis: Secondary | ICD-10-CM

## 2023-04-05 DIAGNOSIS — M1611 Unilateral primary osteoarthritis, right hip: Secondary | ICD-10-CM

## 2023-04-05 NOTE — Telephone Encounter (Signed)
Called and advised pt.

## 2023-04-24 ENCOUNTER — Ambulatory Visit: Payer: BC Managed Care – PPO | Admitting: Orthopaedic Surgery

## 2023-05-22 ENCOUNTER — Ambulatory Visit (INDEPENDENT_AMBULATORY_CARE_PROVIDER_SITE_OTHER): Payer: Self-pay | Admitting: Orthopaedic Surgery

## 2023-05-22 ENCOUNTER — Encounter: Payer: Self-pay | Admitting: Orthopaedic Surgery

## 2023-05-22 DIAGNOSIS — Z96641 Presence of right artificial hip joint: Secondary | ICD-10-CM

## 2023-05-22 DIAGNOSIS — M1611 Unilateral primary osteoarthritis, right hip: Secondary | ICD-10-CM

## 2023-05-22 NOTE — Progress Notes (Signed)
The patient is a 52 year old who is now over 4 months status post a right total hip arthroplasty.  He has known arthritis in his left hip and would like to consider surgery on that left hip before the end of the year.  He says the right hip is doing well other than some stinging sensation pain that occurs intermittently.  He is back to his regular work and not walking with any limp or assistive ice.  His right operative hip moves smoothly and fluidly.  The left hip has stiffness and pain in the groin with internal and external rotation.  At this point we will see him back in 2 months.  At that visit I would like a standing AP pelvis and lateral of both hips.  We can then work on getting him scheduled for a left hip replacement.  All question and concerns were answered and addressed.  He did let me know that he is having some popping in his left shoulder and he definitely has positive Neer and Hawkins signs with pain at the [facility] joint.  If this worsens he will also let us know.

## 2023-05-28 ENCOUNTER — Other Ambulatory Visit: Payer: Self-pay | Admitting: Family Medicine

## 2023-05-28 DIAGNOSIS — M1611 Unilateral primary osteoarthritis, right hip: Secondary | ICD-10-CM

## 2023-07-24 ENCOUNTER — Ambulatory Visit: Payer: BC Managed Care – PPO | Admitting: Orthopaedic Surgery

## 2024-03-20 IMAGING — DX DG LUMBAR SPINE COMPLETE 4+V
4 series · 4 of 4 positions shown · non-contrast
Comparison: None Available.

CLINICAL DATA: Chronic right-sided lower back pain with right
sciatica

EXAM:
LUMBAR SPINE - COMPLETE 4+ VIEW

[lumbar spine ap]
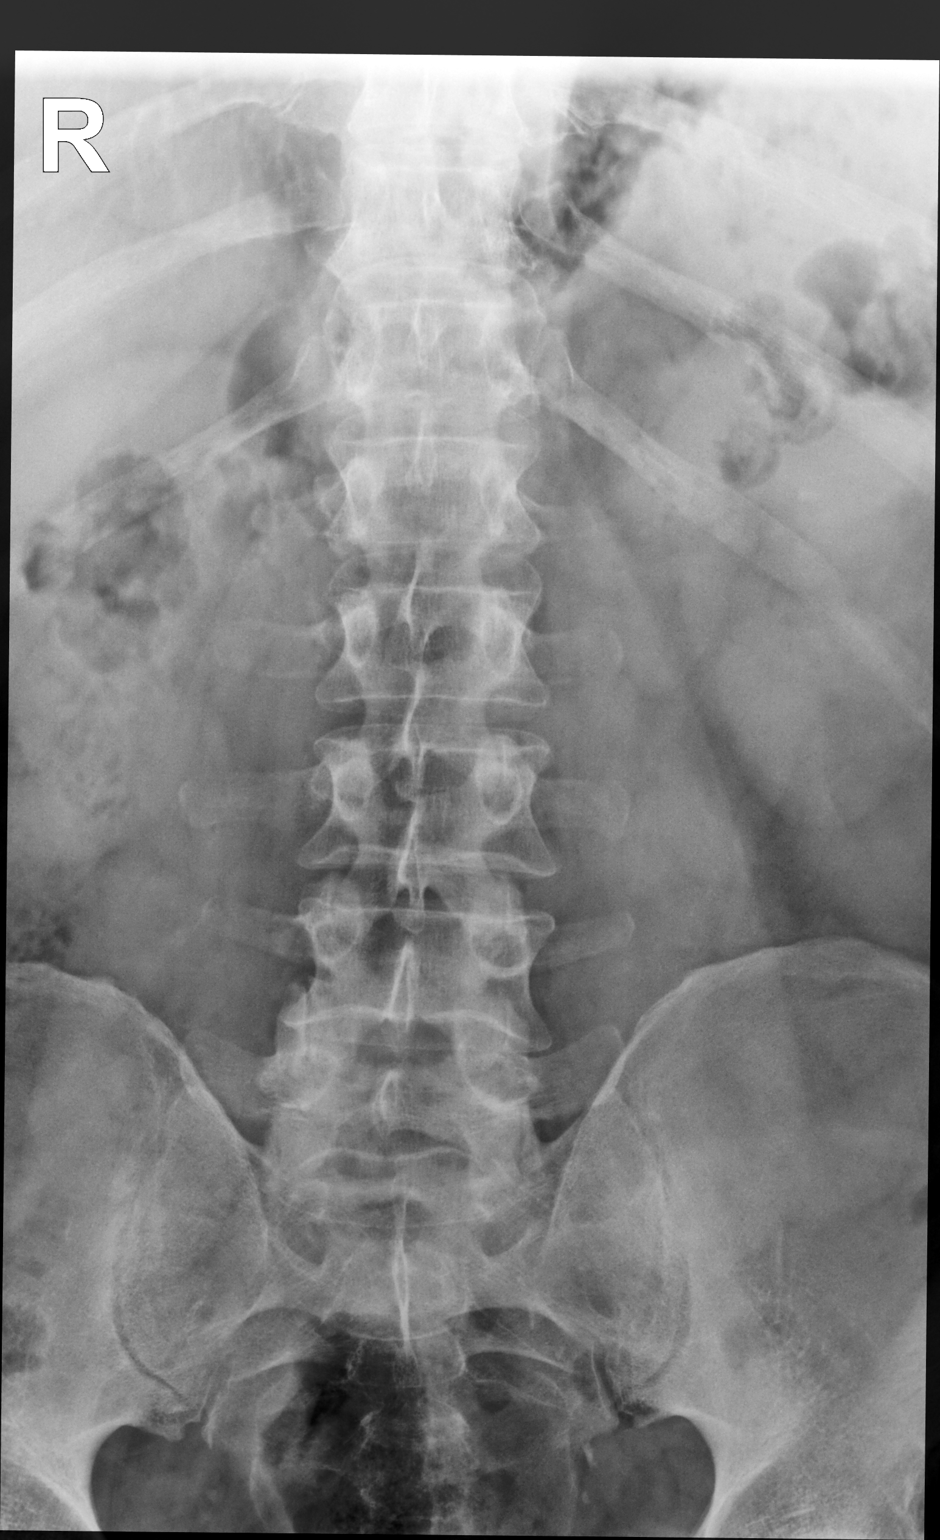

[lumbar spine lmo (1 of 2)]
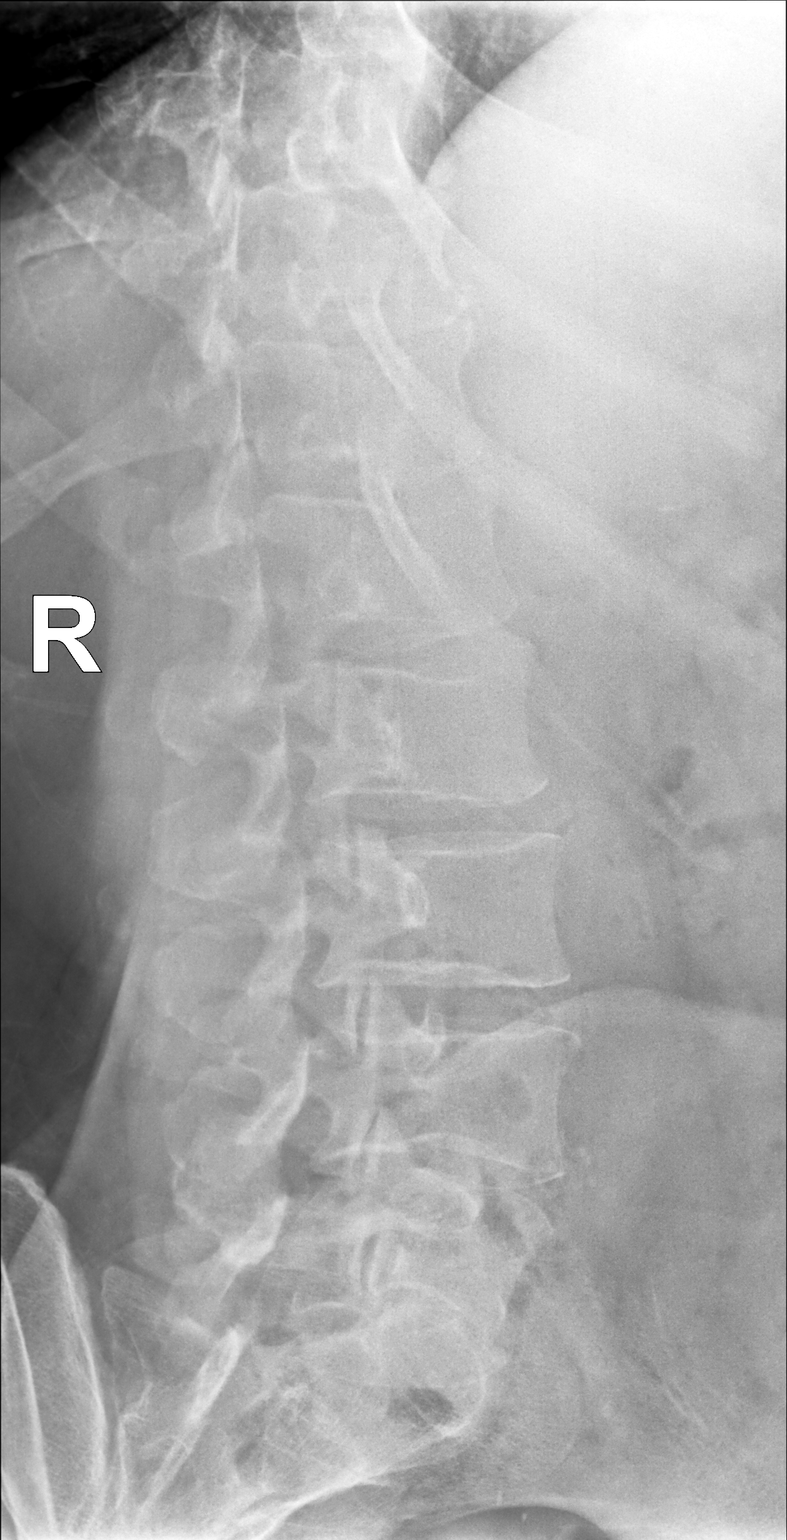

[lumbar spine lmo (2 of 2)]
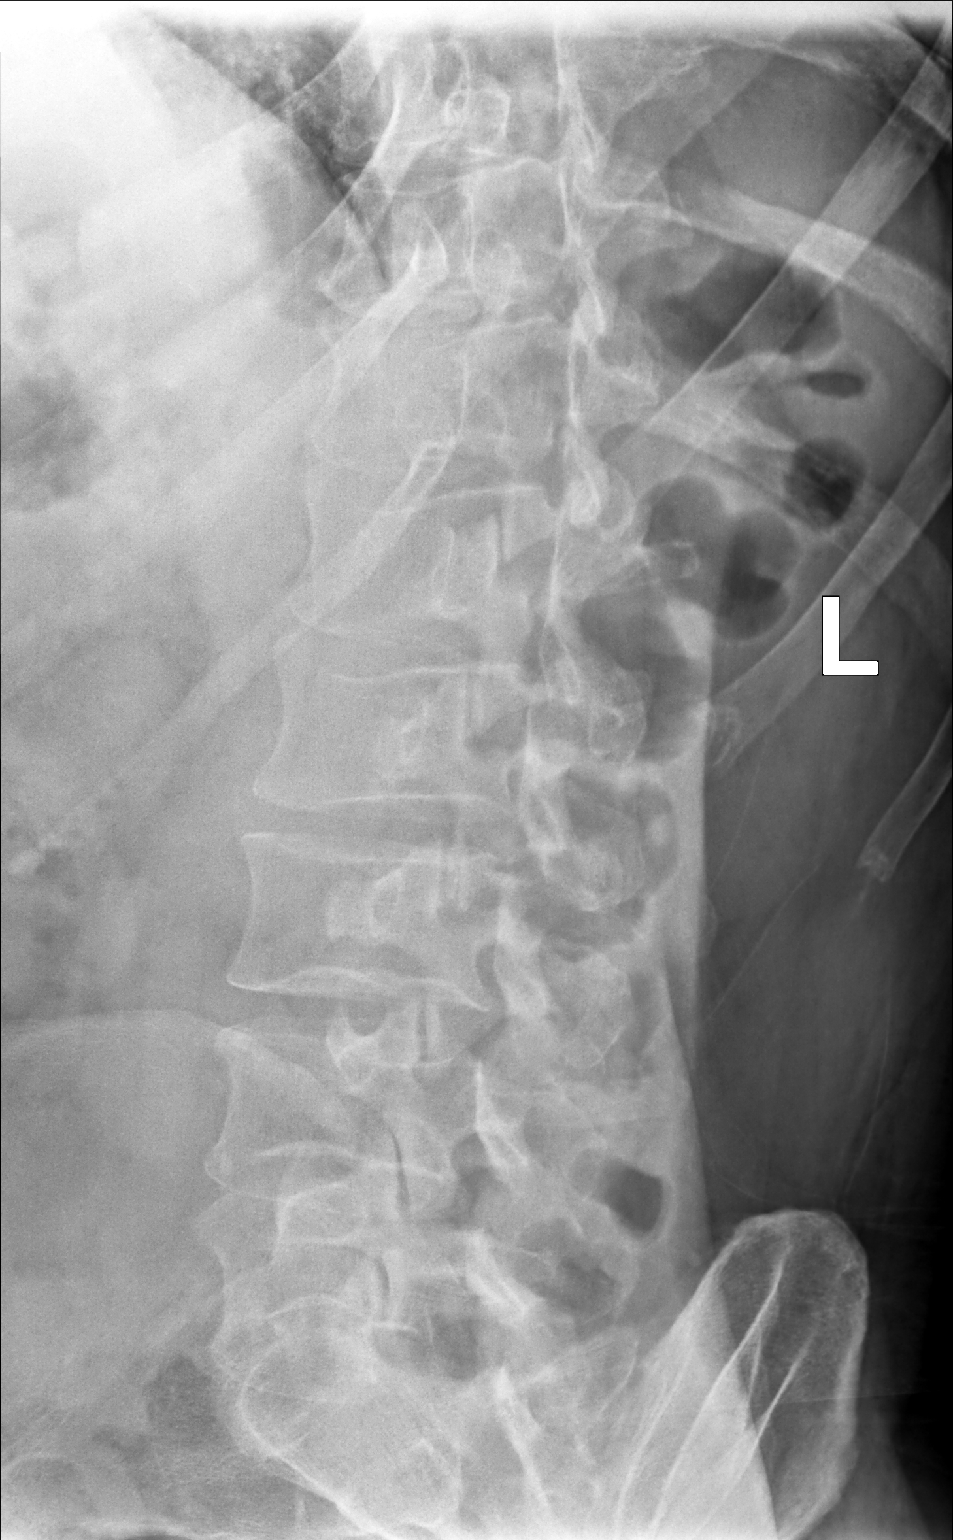

[lumbar spine lat]
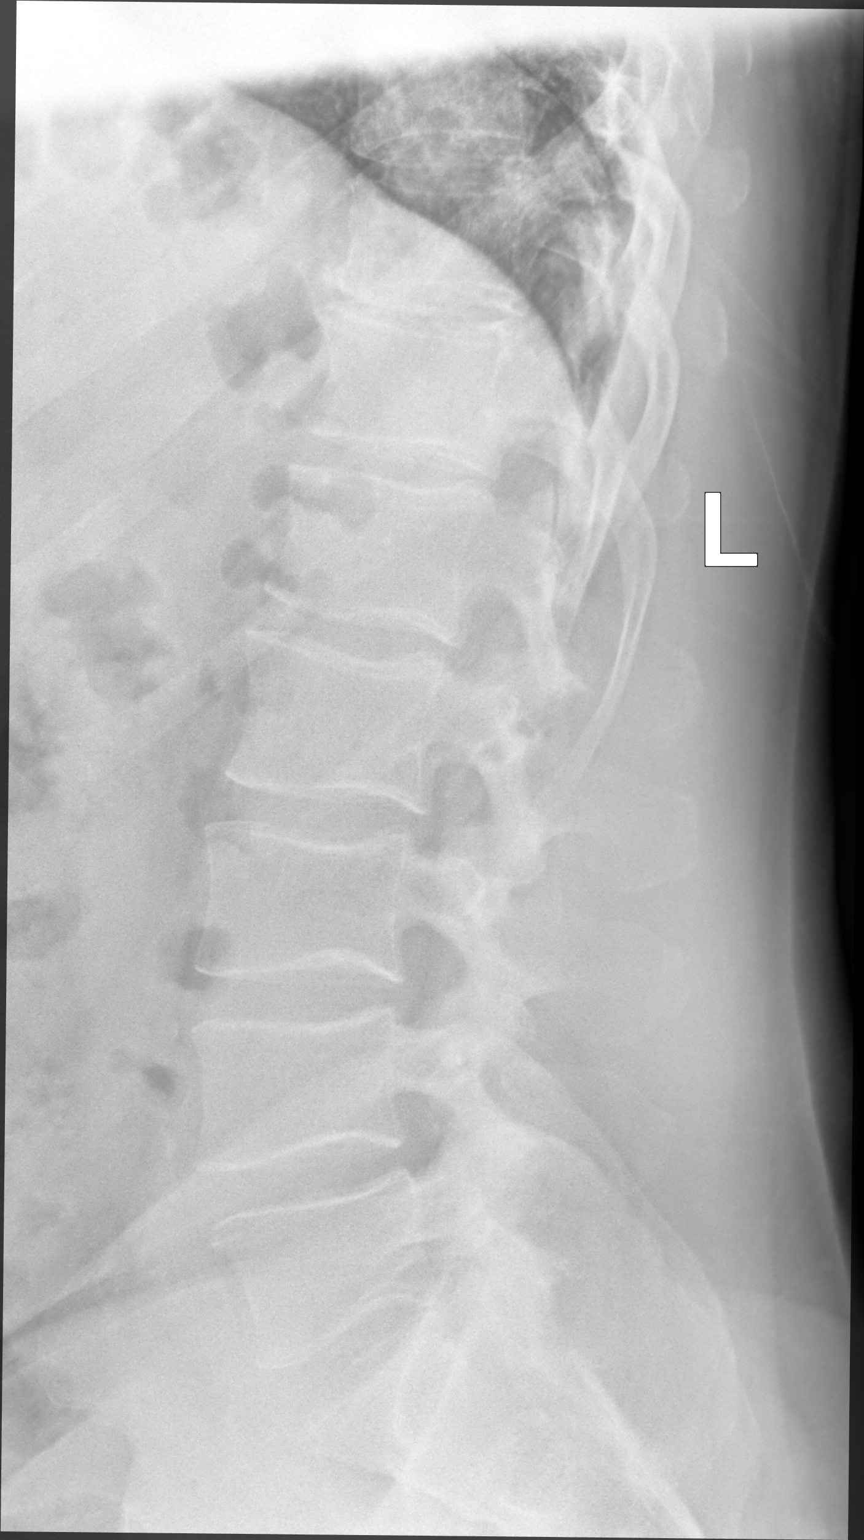

[4 of 4 positions shown; findings below may reference images not displayed]

FINDINGS: Diffusely preserved disc height. No significant endplate spurring.
Borderline facet spurring at L4-5 and L5-S1. No evidence of fracture
or bone lesion.
IMPRESSION: Possible mild lower facet spurring.  No acute or focal finding.

## 2024-08-16 ENCOUNTER — Ambulatory Visit: Admitting: Family Medicine

## 2024-08-16 ENCOUNTER — Encounter: Payer: Self-pay | Admitting: Family Medicine

## 2024-08-16 VITALS — BP 158/83 | HR 77 | Temp 97.0°F | Resp 18 | Wt 259.0 lb

## 2024-08-16 DIAGNOSIS — E66811 Obesity, class 1: Secondary | ICD-10-CM

## 2024-08-16 DIAGNOSIS — K219 Gastro-esophageal reflux disease without esophagitis: Secondary | ICD-10-CM | POA: Diagnosis not present

## 2024-08-16 DIAGNOSIS — E785 Hyperlipidemia, unspecified: Secondary | ICD-10-CM

## 2024-08-16 DIAGNOSIS — J0110 Acute frontal sinusitis, unspecified: Secondary | ICD-10-CM

## 2024-08-16 DIAGNOSIS — I10 Essential (primary) hypertension: Secondary | ICD-10-CM | POA: Diagnosis not present

## 2024-08-16 DIAGNOSIS — Z720 Tobacco use: Secondary | ICD-10-CM

## 2024-08-16 DIAGNOSIS — Z6834 Body mass index (BMI) 34.0-34.9, adult: Secondary | ICD-10-CM

## 2024-08-16 DIAGNOSIS — J4521 Mild intermittent asthma with (acute) exacerbation: Secondary | ICD-10-CM | POA: Diagnosis not present

## 2024-08-16 DIAGNOSIS — Z125 Encounter for screening for malignant neoplasm of prostate: Secondary | ICD-10-CM

## 2024-08-16 DIAGNOSIS — M1612 Unilateral primary osteoarthritis, left hip: Secondary | ICD-10-CM | POA: Diagnosis not present

## 2024-08-16 LAB — POCT INFLUENZA A/B
Influenza A, POC: NEGATIVE
Influenza B, POC: NEGATIVE

## 2024-08-16 LAB — POC COVID19 BINAXNOW: SARS Coronavirus 2 Ag: NEGATIVE

## 2024-08-16 MED ORDER — PREDNISONE 50 MG PO TABS: ORAL_TABLET | ORAL | 0 refills | Status: AC

## 2024-08-16 MED ORDER — ALBUTEROL SULFATE HFA 108 (90 BASE) MCG/ACT IN AERS: 2.0000 | INHALATION_SPRAY | Freq: Four times a day (QID) | RESPIRATORY_TRACT | 0 refills | Status: DC | PRN

## 2024-08-16 MED ORDER — AMOXICILLIN-POT CLAVULANATE 875-125 MG PO TABS: 1.0000 | ORAL_TABLET | Freq: Two times a day (BID) | ORAL | 0 refills | Status: AC

## 2024-08-16 MED ORDER — MELOXICAM 15 MG PO TABS: 15.0000 mg | ORAL_TABLET | Freq: Every day | ORAL | 0 refills | Status: DC

## 2024-08-16 NOTE — Progress Notes (Signed)
 Assessment  Assessment/Plan:  Assessment and Plan Assessment & Plan Acute upper respiratory infection with wheezing and sinus symptoms Acute upper respiratory infection with sinus pressure, nasal congestion, and wheezing for 2-3 days. Wheezing noted in the left upper lung. Possible reactive airway process such as asthma considered. Smoking may exacerbate symptoms. - Prescribe prednisone  50 mg daily for 5 days - Prescribe Augmentin (amoxicillin/clavulanate) 875 mg twice daily for 10 days - Prescribe albuterol inhaler for wheezing - Advise against taking meloxicam  while on prednisone   Essential hypertension Blood pressure elevated at 166/86 mmHg, possibly exacerbated by current illness and pain. - Recheck blood pressure in 2 weeks after acute symptoms resolve  Gastroesophageal reflux disease (GERD) Chronic heartburn previously managed with over-the-counter medications. - Prescribe esomeprazole (Nexium) 40 mg daily - Advise taking esomeprazole 30 minutes before other medications and meals  Osteoarthritis of bilateral hips, status post right hip replacement Status post right hip replacement. Left hip causing discomfort. Previous management with meloxicam . - Prescribe meloxicam  15 mg daily after completion of prednisone  course  Obesity, class 1 Weight gain due to inactivity post-surgery.  Tobacco use Currently smoking less than half a pack per day. Smoking may contribute to respiratory symptoms. - Encourage smoking cessation     Medications Discontinued During This Encounter  Medication Reason   Multiple Vitamins-Minerals (MULTIVITAMIN GUMMIES ADULT PO)    Menthol , Topical Analgesic, (ICY HOT EX)    aspirin  81 MG chewable tablet    oxyCODONE  (OXY IR/ROXICODONE ) 5 MG immediate release tablet    HYDROcodone -acetaminophen  (NORCO/VICODIN) 5-325 MG tablet    methocarbamol  (ROBAXIN ) 500 MG tablet    meloxicam  (MOBIC ) 15 MG tablet    tiZANidine  (ZANAFLEX ) 4 MG tablet    omeprazole   (PRILOSEC) 40 MG capsule   Return in about 2 weeks (around 08/30/2024) for physical (fasting labs).        Subjective:   Encounter date: 08/16/2024  Chief Complaint  Patient presents with   Annual Exam    Pt is not fasting today. Rx refills are needed   HM due- vaccinations and colonoscopy    URI    Pt c/o sinus pressure, mild nasal congestion and ruff of mouth discomfort for 2-3 days. Pt used OTC Theraflu     Discussed the use of AI scribe software for clinical note transcription with the patient, who gave verbal consent to proceed.  History of Present Illness Joel [name] is a 53 year old male who presents with viral upper respiratory infection symptoms and elevated blood pressure.  Viral upper respiratory infection symptoms - Duration of symptoms: 2-3 days - Sinus pressure and nasal congestion present - Discomfort on the roof of the mouth - Severe headache, rated 8 out of 10 - Wheezing and shortness of breath - No chest pain - Use of over-the-counter remedies, including Theraflu  Hypertension - Elevated blood pressure recorded at 166/86 mmHg  Musculoskeletal pain and limited mobility - History of hip surgery resulting in a period of immobility - Weight gain following surgery - Discomfort in the left hip - Previous use of meloxicam  for pain management - Desire to resume arthritis medication previously prescribed  Gastroesophageal reflux symptoms - Chronic heartburn - Use of over-the-counter medications twice daily for symptom control  Pulmonary history - History of pneumonia affecting the left lung - Left lung described as weaker than the right - No inhaler use since childhood       08/16/2024    2:10 PM 03/30/2022    3:33 PM 03/01/2022  4:00 PM  Depression screen PHQ 2/9  Decreased Interest 1 0 0  Down, Depressed, Hopeless 2 0 0  PHQ - 2 Score 3 0 0  Altered sleeping 2    Tired, decreased energy 2    Change in appetite 2    Feeling  bad or failure about yourself  1    Trouble concentrating 0    Moving slowly or fidgety/restless 2    Suicidal thoughts 0    PHQ-9 Score 12    Difficult doing work/chores Somewhat difficult         08/16/2024    2:10 PM  GAD 7 : Generalized Anxiety Score  Nervous, Anxious, on Edge 2  Control/stop worrying 2  Worry too much - different things 2  Trouble relaxing 2  Restless 2  Easily annoyed or irritable 1  Afraid - awful might happen 1  Total GAD 7 Score 12  Anxiety Difficulty Somewhat difficult    Health Maintenance Due  Topic Date Due   HIV Screening  Never done   Hepatitis C Screening  Never done   Pneumococcal Vaccine: 50+ Years (1 of 2 - PCV) Never done   Hepatitis B Vaccines 19-59 Average Risk (1 of 3 - 19+ 3-dose series) Never done   Colonoscopy  Never done   Zoster Vaccines- Shingrix (1 of 2) Never done   DTaP/Tdap/Td (2 - Td or Tdap) 04/04/2024   Influenza Vaccine  Never done      PMH:  The following were reviewed and entered/updated in epic: Past Medical History:  Diagnosis Date   Arthritis    GERD (gastroesophageal reflux disease)    takes Prilosec bid   High cholesterol    History of kidney stones    Infection of index finger    dog bite    Patient Active Problem List   Diagnosis Date Noted   Status post total replacement of right hip 01/17/2023   Gastroesophageal reflux disease 11/28/2022   Hypertension 03/01/2022   Chronic right-sided low back pain with right-sided sciatica 03/01/2022   Anxiety 03/01/2022   Chronic pain of left ankle 03/01/2022   Tobacco use 03/01/2022   Obesity (BMI 30-39.9) 03/01/2022    Past Surgical History:  Procedure Laterality Date   ELBOW BURSA SURGERY  2011   Right   I & D EXTREMITY  03/08/2012   Procedure: IRRIGATION AND DEBRIDEMENT EXTREMITY;  Surgeon: Jerona LULLA Sage, MD;  Location: [facility] OR;  Service: Orthopedics;  Laterality: Left;  Left Index Finger Irrigation and Debridement, Place Antibiotic Beads   neck cyst  removed     at age 27   TOTAL HIP ARTHROPLASTY Right 01/17/2023   Procedure: RIGHT TOTAL HIP ARTHROPLASTY ANTERIOR APPROACH;  Surgeon: Vernetta Lonni GRADE, MD;  Location: [facility] OR;  Service: Orthopedics;  Laterality: Right;    History reviewed. No pertinent family history.  Medications- reviewed and updated Outpatient Medications Prior to Visit  Medication Sig Dispense Refill   rosuvastatin  (CRESTOR ) 20 MG tablet Take 1 tablet (20 mg total) by mouth daily. (Patient not taking: Reported on 08/16/2024) 90 tablet 3   aspirin  81 MG chewable tablet Chew 1 tablet (81 mg total) by mouth 2 (two) times daily. (Patient not taking: Reported on 08/16/2024) 30 tablet 0   HYDROcodone -acetaminophen  (NORCO/VICODIN) 5-325 MG tablet Take 1 tablet by mouth 3 (three) times daily as needed for moderate pain. (Patient not taking: Reported on 08/16/2024) 30 tablet 0   meloxicam  (MOBIC ) 15 MG tablet TAKE 1 TABLET (15 MG  TOTAL) BY MOUTH DAILY. (Patient not taking: Reported on 08/16/2024) 30 tablet 0   Menthol , Topical Analgesic, (ICY HOT EX) Apply 1 Application topically daily as needed (pain). (Patient not taking: Reported on 08/16/2024)     methocarbamol  (ROBAXIN ) 500 MG tablet TAKE 1 TABLET BY MOUTH THREE TIMES A DAY (Patient not taking: Reported on 08/16/2024) 90 tablet 0   Multiple Vitamins-Minerals (MULTIVITAMIN GUMMIES ADULT PO) Take 2 tablets by mouth daily. (Patient not taking: Reported on 08/16/2024)     omeprazole  (PRILOSEC) 40 MG capsule TAKE 1 CAPSULE (40 MG TOTAL) BY MOUTH 2 (TWO) TIMES DAILY BEFORE A MEAL. (Patient not taking: Reported on 08/16/2024) 180 capsule 0   oxyCODONE  (OXY IR/ROXICODONE ) 5 MG immediate release tablet Take 1 tablet (5 mg total) by mouth every 6 (six) hours as needed for moderate pain (pain score 4-6). (Patient not taking: Reported on 08/16/2024) 30 tablet 0   tiZANidine  (ZANAFLEX ) 4 MG tablet Take 1 tablet (4 mg total) by mouth every 6 (six) hours as needed for muscle spasms.  (Patient not taking: Reported on 08/16/2024) 30 tablet 0   No facility-administered medications prior to visit.    No Known Allergies  Social History   Socioeconomic History   Marital status: Divorced    Spouse name: Not on file   Number of children: Not on file   Years of education: Not on file   Highest education level: Not on file  Occupational History   Not on file  Tobacco Use   Smoking status: Every Day    Current packs/day: 0.50    Average packs/day: 0.5 packs/day for 6.0 years (3.0 ttl pk-yrs)    Types: Cigarettes    Passive exposure: Yes   Smokeless tobacco: Former    Types: Chew   Tobacco comments:    Pt smokes 1/2 pack per day   Vaping Use   Vaping status: Never Used  Substance and Sexual Activity   Alcohol use: Yes    Comment: occasional   Drug use: No   Sexual activity: Yes  Other Topics Concern   Not on file  Social History Narrative   Not on file   Social Drivers of Health   Financial Resource Strain: Not on file  Food Insecurity: Not on file  Transportation Needs: Not on file  Physical Activity: Not on file  Stress: Not on file  Social Connections: Not on file           Objective:  Physical Exam: BP (!) 158/83 (BP Location: Left Arm, Patient Position: Sitting, Cuff Size: Large) Comment: recheck after resting  Pulse 77   Temp (!) 97 F (36.1 C) (Temporal)   Resp 18   Wt 259 lb (117.5 kg)   SpO2 95%   BMI 36.12 kg/m   Body mass index is 36.12 kg/m. Wt Readings from Last 3 Encounters:  08/16/24 259 lb (117.5 kg)  01/17/23 235 lb (106.6 kg)  01/12/23 235 lb 14.4 oz (107 kg)    Physical Exam VITALS: BP- 166/86 GENERAL: Alert, cooperative, well developed, no acute distress. HEENT: Normocephalic, normal oropharynx, moist mucous membranes. CHEST: Wheezing present in lungs. CARDIOVASCULAR: Normal heart rate and rhythm, S1 and S2 normal without murmurs. ABDOMEN: Soft, non-tender, non-distended, without organomegaly, normal bowel  sounds. EXTREMITIES: No cyanosis or edema. NEUROLOGICAL: Cranial nerves grossly intact, moves all extremities without gross motor or sensory deficit.  Physical Exam      Prior labs:   Recent Results (from the past 2160 hours)  POCT Influenza  A/B     Status: Normal   Collection Time: 08/16/24  1:49 PM  Result Value Ref Range   Influenza A, POC Negative Negative   Influenza B, POC Negative Negative  POC COVID-19 BinaxNow     Status: Normal   Collection Time: 08/16/24  1:49 PM  Result Value Ref Range   SARS Coronavirus 2 Ag Negative Negative    Lab Results  Component Value Date   CHOL 206 (H) 11/28/2022   CHOL 184 03/01/2022   Lab Results  Component Value Date   HDL 38.90 (L) 11/28/2022   HDL 33.80 (L) 03/01/2022   No results found for: [facility] Lab Results  Component Value Date   TRIG (H) 11/28/2022    477.0 Triglyceride is over 400; calculations on Lipids are invalid.   TRIG (H) 03/01/2022    544.0 Triglyceride is over 400; calculations on Lipids are invalid.   Lab Results  Component Value Date   CHOLHDL 5 11/28/2022   CHOLHDL 5 03/01/2022   Lab Results  Component Value Date   LDLDIRECT 109.0 11/28/2022   LDLDIRECT 110.0 03/01/2022    Last metabolic panel Lab Results  Component Value Date   GLUCOSE 116 (H) 01/12/2023   NA 135 01/12/2023   K 4.3 01/12/2023   CL 99 01/12/2023   CO2 26 01/12/2023   BUN 18 01/12/2023   CREATININE 0.86 01/12/2023   GFRNONAA >60 01/12/2023   CALCIUM  9.2 01/12/2023   PROT 6.4 (L) 01/12/2023   ALBUMIN 3.9 01/12/2023   BILITOT 0.6 01/12/2023   ALKPHOS 38 01/12/2023   AST 29 01/12/2023   ALT 44 01/12/2023   ANIONGAP 10 01/12/2023    Lab Results  Component Value Date   HGBA1C 6.2 11/28/2022    Last CBC Lab Results  Component Value Date   WBC 10.2 01/12/2023   HGB 14.8 01/12/2023   HCT 43.0 01/12/2023   MCV 93.7 01/12/2023   MCH 32.2 01/12/2023   RDW 12.8 01/12/2023   PLT 211 01/12/2023    Lab Results   Component Value Date   TSH 3.38 11/28/2022    Lab Results  Component Value Date   PSA 1.04 03/01/2022    Last vitamin D No results found for: MARIEN BOLLS, VD25OH  Lab Results  Component Value Date   BILIRUBINUR NEGATIVE 08/07/2015   PROTEINUR NEGATIVE 08/07/2015   UROBILINOGEN 1.0 08/07/2015   LEUKOCYTESUR SMALL (A) 08/07/2015    No results found for: LABMICR, MICROALBUR   At today's visit, we discussed treatment options, associated risk and benefits, and engage in counseling as needed.  Additionally the following were reviewed: Past medical records, past medical and surgical history, family and social background, as well as relevant laboratory results, imaging findings, and specialty notes, where applicable.  This message was generated using dictation software, and as a result, it may contain unintentional typos or errors.  Nevertheless, extensive effort was made to accurately convey at the pertinent aspects of the patient visit.    There may have been are other unrelated non-urgent complaints, but due to the busy schedule and the amount of time already spent with him, time does not permit to address these issues at today's visit. Another appointment may have or has been requested to review these additional issues.     Arvella Hummer, MD, MS

## 2024-08-30 ENCOUNTER — Ambulatory Visit: Admitting: Family Medicine

## 2024-08-30 VITALS — BP 170/87 | HR 74 | Temp 97.0°F | Resp 18 | Wt 262.8 lb

## 2024-08-30 DIAGNOSIS — Z0000 Encounter for general adult medical examination without abnormal findings: Secondary | ICD-10-CM

## 2024-08-30 DIAGNOSIS — G8929 Other chronic pain: Secondary | ICD-10-CM

## 2024-08-30 DIAGNOSIS — Z96641 Presence of right artificial hip joint: Secondary | ICD-10-CM | POA: Diagnosis not present

## 2024-08-30 DIAGNOSIS — G2581 Restless legs syndrome: Secondary | ICD-10-CM | POA: Diagnosis not present

## 2024-08-30 DIAGNOSIS — J4521 Mild intermittent asthma with (acute) exacerbation: Secondary | ICD-10-CM | POA: Diagnosis not present

## 2024-08-30 DIAGNOSIS — K2100 Gastro-esophageal reflux disease with esophagitis, without bleeding: Secondary | ICD-10-CM

## 2024-08-30 DIAGNOSIS — Z125 Encounter for screening for malignant neoplasm of prostate: Secondary | ICD-10-CM

## 2024-08-30 DIAGNOSIS — N5203 Combined arterial insufficiency and corporo-venous occlusive erectile dysfunction: Secondary | ICD-10-CM

## 2024-08-30 DIAGNOSIS — Z1211 Encounter for screening for malignant neoplasm of colon: Secondary | ICD-10-CM

## 2024-08-30 DIAGNOSIS — E669 Obesity, unspecified: Secondary | ICD-10-CM | POA: Diagnosis not present

## 2024-08-30 DIAGNOSIS — I10 Essential (primary) hypertension: Secondary | ICD-10-CM | POA: Diagnosis not present

## 2024-08-30 DIAGNOSIS — M5441 Lumbago with sciatica, right side: Secondary | ICD-10-CM | POA: Diagnosis not present

## 2024-08-30 DIAGNOSIS — E785 Hyperlipidemia, unspecified: Secondary | ICD-10-CM

## 2024-08-30 DIAGNOSIS — Z9189 Other specified personal risk factors, not elsewhere classified: Secondary | ICD-10-CM | POA: Diagnosis not present

## 2024-08-30 DIAGNOSIS — Z1159 Encounter for screening for other viral diseases: Secondary | ICD-10-CM

## 2024-08-30 DIAGNOSIS — Z1212 Encounter for screening for malignant neoplasm of rectum: Secondary | ICD-10-CM | POA: Diagnosis not present

## 2024-08-30 DIAGNOSIS — Z720 Tobacco use: Secondary | ICD-10-CM | POA: Diagnosis not present

## 2024-08-30 MED ORDER — AMLODIPINE BESYLATE 10 MG PO TABS: 10.0000 mg | ORAL_TABLET | Freq: Every day | ORAL | 0 refills | Status: DC

## 2024-08-30 MED ORDER — GABAPENTIN 300 MG PO CAPS: 300.0000 mg | ORAL_CAPSULE | Freq: Three times a day (TID) | ORAL | 3 refills | Status: DC

## 2024-08-30 MED ORDER — GABAPENTIN 300 MG PO CAPS: 300.0000 mg | ORAL_CAPSULE | Freq: Three times a day (TID) | ORAL | 3 refills | Status: AC

## 2024-08-30 NOTE — Progress Notes (Addendum)
 Assessment  Assessment/Plan:     Assessment and Plan Assessment & Plan Assessment & Plan Essential Hypertension Persistent elevated BP readings; risk factors include obesity and possible OSA. No prior pharmacologic therapy. Plan:  Start amlodipine  10 mg daily. Home BP monitoring; bring log to next visit. Lifestyle counseling: DASH diet, weight reduction, exercise. Labs ordered: CMP (renal function before antihypertensive), CBC (baseline), A1c (screen for diabetes), microalbumin/creatinine ratio (early nephropathy screening), lipid panel (CV risk assessment), TSH (secondary causes of HTN), CRP and Apo A1/B ratio (CV risk stratification).   Hyperlipidemia Previously on rosuvastatin  but non-adherent; last panel showed elevated TC and TG. Plan:  Restart rosuvastatin  20 mg daily. Repeat lipid panel and Apo A1/B ratio for risk stratification. Counsel on diet and exercise.   Obesity (BMI 30-39) Significant weight gain post-hip surgery; increases risk for HTN, OSA, and metabolic syndrome. Plan:  Nutrition counseling and physical activity as tolerated. Labs ordered: A1c, insulin , C-peptide (screen for insulin  resistance), vitamin D  (deficiency common in obesity).   Gastroesophageal Reflux Disease Controlled on esomeprazole 40 mg daily. Plan:  Continue current regimen. Reinforce lifestyle measures (avoid late meals, elevate head of bed).   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 Plan:  Continue meloxicam  15 mg daily PRN. Start gabapentin  300 mg TID PRN for neuropathic pain/restless legs. Consider imaging if hip pain worsens.   Restless Legs Syndrome Nighttime leg discomfort interfering with sleep. Plan:  Start gabapentin  300 mg TID PRN. Labs ordered: Iron/TIBC/ferritin, B12, folate (common reversible causes), vitamin D , TSH.   Suspected Obstructive Sleep  Apnea Snoring, poor sleep quality, obesity, HTN suggest OSA. Plan:  Order home sleep study. Discuss OSA implications for BP and cognition.   Reactive Airway Disease / Recent Bronchitis Wheezing improved after antibiotics and steroids; residual symptoms minimal. Plan:  Complete prescribed course. Continue albuterol PRN. Monitor for recurrence.   Emotional Distress Ongoing grief since sister's death; occasional marijuana use. Plan:  Offer counseling resources. Monitor mood; repeat PHQ-9 at future visit.   Tobacco Use < pack/day. Plan:  Advise cessation; offer pharmacologic support if ready.   General Health Maintenance Plan:  Ordered HIV and hepatitis C screening (routine preventive care). PSA for prostate cancer screening. Schedule colonoscopy for colorectal cancer screening. Comprehensive labs (CBC, CMP, A1c, lipid panel, thyroid, vitamin D , CRP, insulin , C-peptide) for metabolic and cardiovascular risk assessment.  Erectile Dysfunction Trial tadalafil  5 mg daily    There are no discontinued medications.  Patient Counseling(The following topics were reviewed and/or handout was given):  -Nutrition: Stressed importance of moderation in sodium/caffeine intake, saturated fat and cholesterol, caloric balance, sufficient intake of fresh fruits, vegetables, and fiber.  -Stressed the importance of regular exercise.   -Substance Abuse: Discussed cessation/primary prevention of tobacco, alcohol, or other drug use; driving or other dangerous activities under the influence; availability of treatment for abuse.   -Injury prevention: Discussed safety belts, safety helmets, smoke detector, smoking near bedding or upholstery.   -Sexuality: Discussed sexually transmitted diseases, partner selection, use of condoms, avoidance of unintended pregnancy and contraceptive alternatives.   -Dental health: Discussed importance of regular tooth brushing, flossing, and dental visits.   -Health maintenance and immunizations reviewed. Please refer to Health maintenance section.  Return in about 1 month (around 09/29/2024) for BP.        Subjective:   Encounter date: 08/30/2024  Chief Complaint  Patient presents with   Annual Exam  Pt is not fasting today  HM due- vaccinations and colonoscopy    URI    Pt stated symptoms have improved with a little congestion still present. Pt is taking antibiotic that was prescribed.     Discussed the use of AI scribe software for clinical note transcription with the patient, who gave verbal consent to proceed.  History of Present Illness Joel [name] is a 53 year old male who presents with persistent wheezing and respiratory symptoms.  Respiratory symptoms: Persistent wheezing and congestion since prior visit; improved significantly within 3 days after Augmentin, prednisone , and albuterol. Currently feels somewhat better. Mother had similar illness and responded to same treatment. GERD: On esomeprazole (switched from omeprazole ); symptoms controlled. Hyperlipidemia: Previously prescribed rosuvastatin  20 mg; not taking as of 08/16/24. Last lipid panel (11/28/22): Cholesterol 206 mg/dL, Triglycerides 522 mg/dL, HDL 61.0 mg/dL. Hypertension: Elevated office BPs on 08/16/24 (159/81, 166/86, 158/83 mmHg); possibly illness-related. Monitors BP at home using mother's cuff; not on medication. Musculoskeletal pain: Chronic right-sided low back pain with sciatica; managed with meloxicam . New left hip pain described as "bone on bone." Status post right hip replacement (01/17/23). Restless legs: Nighttime symptoms interfering with sleep. Sleep disturbance: Wakes hourly; feels unrested; acknowledges snoring. Emotional distress: Ongoing sadness since sister's death in November 19, 2023; supporting mother through grief. Occasional marijuana use to relax but reducing due to introspection. Tobacco use: < pack/day. Obesity: Weight gain  post-hip surgery (235 lbs in Mar 2024 ? 259 lbs recently). Recent URI: Seen 08/16/24; treated with Augmentin, prednisone , and albuterol for sinus pressure, congestion, and wheezing. ED: Patient has ED. Wanting to try something to help with sexual performance.       08/30/2024    4:05 PM 08/16/2024    2:10 PM 03/30/2022    3:33 PM 03/01/2022    4:00 PM  Depression screen PHQ 2/9  Decreased Interest 1 1 0 0  Down, Depressed, Hopeless 1 2 0 0  PHQ - 2 Score 2 3 0 0  Altered sleeping 1 2    Tired, decreased energy 1 2    Change in appetite 0 2    Feeling bad or failure about yourself  1 1    Trouble concentrating 1 0    Moving slowly or fidgety/restless 0 2    Suicidal thoughts 0 0    PHQ-9 Score 6 12    Difficult doing work/chores Somewhat difficult Somewhat difficult         08/30/2024    4:05 PM 08/16/2024    2:10 PM  GAD 7 : Generalized Anxiety Score  Nervous, Anxious, on Edge 1 2  Control/stop worrying 1 2  Worry too much - different things 1 2  Trouble relaxing 1 2  Restless 1 2  Easily annoyed or irritable 1 1  Afraid - awful might happen 1 1  Total GAD 7 Score 7 12  Anxiety Difficulty Somewhat difficult Somewhat difficult    Health Maintenance Due  Topic Date Due   HIV Screening  Never done   Hepatitis C Screening  Never done   Pneumococcal Vaccine: 50+ Years (1 of 2 - PCV) Never done   Hepatitis B Vaccines 19-59 Average Risk (1 of 3 - 19+ 3-dose series) Never done   Colonoscopy  Never done   Zoster Vaccines- Shingrix (1 of 2) Never done   DTaP/Tdap/Td (2 - Td or Tdap) 04/04/2024   Influenza Vaccine  Never done      PMH:  The following were reviewed and  entered/updated in epic: Past Medical History:  Diagnosis Date   Arthritis    GERD (gastroesophageal reflux disease)    takes Prilosec bid   High cholesterol    History of kidney stones    Infection of index finger    dog bite    Patient Active Problem List   Diagnosis Date Noted   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/2023   Gastroesophageal reflux disease 11/28/2022   Hypertension 03/01/2022   Chronic right-sided low back pain with right-sided sciatica 03/01/2022   Anxiety 03/01/2022   Chronic pain of left ankle 03/01/2022   Tobacco use 03/01/2022   Obesity (BMI 30-39.9) 03/01/2022    Past Surgical History:  Procedure Laterality Date   ELBOW BURSA SURGERY  2011   Right   I & D EXTREMITY  03/08/2012   Procedure: IRRIGATION AND DEBRIDEMENT EXTREMITY;  Surgeon: Jerona LULLA Sage, MD;  Location: [facility] OR;  Service: Orthopedics;  Laterality: Left;  Left Index Finger Irrigation and Debridement, Place Antibiotic Beads   neck cyst removed     at age 21   TOTAL HIP ARTHROPLASTY Right 01/17/2023   Procedure: RIGHT TOTAL HIP ARTHROPLASTY ANTERIOR APPROACH;  Surgeon: Vernetta Lonni GRADE, MD;  Location: [facility] OR;  Service: Orthopedics;  Laterality: Right;    No family history on file.  Medications- reviewed and updated Outpatient Medications Prior to Visit  Medication Sig Dispense Refill   albuterol (VENTOLIN HFA) 108 (90 Base) MCG/ACT inhaler Inhale 2 puffs into the lungs every 6 (six) hours as needed for wheezing or shortness of breath. 8 g 0   amoxicillin-clavulanate (AUGMENTIN) 875-125 MG tablet Take 1 tablet by mouth 2 (two) times daily. 20 tablet 0   esomeprazole (NEXIUM) 40 MG capsule Take 1 capsule (40 mg total) by mouth daily. 30 capsule 3   meloxicam  (MOBIC ) 15 MG tablet Take 1 tablet (15 mg total) by mouth daily. 30 tablet 0   predniSONE  (DELTASONE ) 50 MG tablet Take 1 tablet daily for 5 days. (Patient not taking: Reported on 08/30/2024) 5 tablet 0   rosuvastatin  (CRESTOR ) 20 MG tablet Take 1 tablet (20 mg total) by mouth daily. (Patient not taking: Reported on 08/30/2024) 90 tablet 3   No facility-administered medications prior to visit.    No Known Allergies  Social History   Socioeconomic History   Marital status: Divorced    Spouse name: Not on file    Number of children: Not on file   Years of education: Not on file   Highest education level: 12th grade  Occupational History   Not on file  Tobacco Use   Smoking status: Every Day    Current packs/day: 0.50    Average packs/day: 0.5 packs/day for 6.0 years (3.0 ttl pk-yrs)    Types: Cigarettes    Passive exposure: Yes   Smokeless tobacco: Former    Types: Chew   Tobacco comments:    Pt smokes 1/2 pack per day   Vaping Use   Vaping status: Never Used  Substance and Sexual Activity   Alcohol use: Yes    Comment: occasional   Drug use: No   Sexual activity: Yes    Birth control/protection: None  Other Topics Concern   Not on file  Social History Narrative   Not on file   Social Drivers of Health   Financial Resource Strain: Low Risk  (08/30/2024)   Overall Financial Resource Strain (CARDIA)    Difficulty of Paying Living Expenses: Not very hard  Food Insecurity: Unknown (08/30/2024)   Hunger Vital Sign    Worried About Running Out of Food in the Last Year: Never true    Ran Out of Food in the Last Year: Patient declined  Transportation Needs: No Transportation Needs (08/30/2024)   PRAPARE - Administrator, Civil Service (Medical): No    Lack of Transportation (Non-Medical): No  Physical Activity: Not on file  Stress: No Stress Concern Present (08/30/2024)   [employer] of Occupational Health - Occupational Stress Questionnaire    Feeling of Stress: Only a little  Social Connections: Moderately Integrated (08/30/2024)   Social Connection and Isolation Panel    Frequency of Communication with Friends and Family: More than three times a week    Frequency of Social Gatherings with Friends and Family: Once a week    Attends Religious Services: More than 4 times per year    Active Member of [employer] or Organizations: Yes    Attends Engineer, Structural: More than 4 times per year    Marital Status: Divorced           Objective:  Physical  Exam: BP (!) 170/87 (BP Location: Right Arm, Patient Position: Sitting, Cuff Size: Large) Comment: recheck  Pulse 74   Temp (!) 97 F (36.1 C) (Temporal)   Resp 18   Wt 262 lb 12.8 oz (119.2 kg)   SpO2 95%   BMI 36.65 kg/m   Body mass index is 36.65 kg/m. Wt Readings from Last 3 Encounters:  08/30/24 262 lb 12.8 oz (119.2 kg)  08/16/24 259 lb (117.5 kg)  01/17/23 235 lb (106.6 kg)    Physical Exam Constitutional:      General: He is not in acute distress.    Appearance: Normal appearance. He is not ill-appearing or toxic-appearing.  HENT:     Head: Normocephalic and atraumatic.     Right Ear: Hearing, tympanic membrane, ear canal and external ear normal. There is no impacted cerumen.     Left Ear: Hearing, tympanic membrane, ear canal and external ear normal. There is no impacted cerumen.     Nose: Nose normal. No congestion.     Mouth/Throat:     Lips: No lesions.     Mouth: Mucous membranes are moist.     Pharynx: Oropharynx is clear. No oropharyngeal exudate.  Eyes:     General: Lids are normal. Vision grossly intact. Gaze aligned appropriately. No scleral icterus.       Right eye: No discharge.        Left eye: No discharge.     Extraocular Movements: Extraocular movements intact.     Right eye: Normal extraocular motion.     Left eye: Normal extraocular motion.     Conjunctiva/sclera: Conjunctivae normal.     Pupils: Pupils are equal, round, and reactive to light.     Visual Fields: Right eye visual fields normal and left eye visual fields normal.  Neck:     Thyroid: No thyroid mass, thyromegaly or thyroid tenderness.  Cardiovascular:     Rate and Rhythm: Normal rate and regular rhythm.     Pulses: Normal pulses.     Heart sounds: Normal heart sounds.  Pulmonary:     Effort: Pulmonary effort is normal. No respiratory distress.     Breath sounds: Normal breath sounds.  Abdominal:     General: Abdomen is flat. Bowel sounds are normal.     Palpations: Abdomen is  soft.  Tenderness: There is no abdominal tenderness.  Musculoskeletal:        General: Normal range of motion.     Cervical back: Normal range of motion.     Right lower leg: No edema.     Left lower leg: No edema.  Lymphadenopathy:     Cervical: No cervical adenopathy.  Skin:    General: Skin is warm and dry.     Findings: No rash.  Neurological:     General: No focal deficit present.     Mental Status: He is alert and oriented to person, place, and time. Mental status is at baseline.     Cranial Nerves: No cranial nerve deficit.     Deep Tendon Reflexes:     Reflex Scores:      Patellar reflexes are 2+ on the right side and 2+ on the left side. Psychiatric:        Mood and Affect: Mood normal.        Behavior: Behavior normal.        Thought Content: Thought content normal.        Judgment: Judgment normal.         Prior labs:   Recent Results (from the past 2160 hours)  POCT Influenza A/B     Status: Normal   Collection Time: 08/16/24  1:49 PM  Result Value Ref Range   Influenza A, POC Negative Negative   Influenza B, POC Negative Negative  POC COVID-19 BinaxNow     Status: Normal   Collection Time: 08/16/24  1:49 PM  Result Value Ref Range   SARS Coronavirus 2 Ag Negative Negative    Lab Results  Component Value Date   CHOL 206 (H) 11/28/2022   CHOL 184 03/01/2022   Lab Results  Component Value Date   HDL 38.90 (L) 11/28/2022   HDL 33.80 (L) 03/01/2022   No results found for: [facility] Lab Results  Component Value Date   TRIG (H) 11/28/2022    477.0 Triglyceride is over 400; calculations on Lipids are invalid.   TRIG (H) 03/01/2022    544.0 Triglyceride is over 400; calculations on Lipids are invalid.   Lab Results  Component Value Date   CHOLHDL 5 11/28/2022   CHOLHDL 5 03/01/2022   Lab Results  Component Value Date   LDLDIRECT 109.0 11/28/2022   LDLDIRECT 110.0 03/01/2022    Last metabolic panel Lab Results  Component Value Date    GLUCOSE 116 (H) 01/12/2023   NA 135 01/12/2023   K 4.3 01/12/2023   CL 99 01/12/2023   CO2 26 01/12/2023   BUN 18 01/12/2023   CREATININE 0.86 01/12/2023   GFRNONAA >60 01/12/2023   CALCIUM  9.2 01/12/2023   PROT 6.4 (L) 01/12/2023   ALBUMIN 3.9 01/12/2023   BILITOT 0.6 01/12/2023   ALKPHOS 38 01/12/2023   AST 29 01/12/2023   ALT 44 01/12/2023   ANIONGAP 10 01/12/2023    Lab Results  Component Value Date   HGBA1C 6.2 11/28/2022    Last CBC Lab Results  Component Value Date   WBC 10.2 01/12/2023   HGB 14.8 01/12/2023   HCT 43.0 01/12/2023   MCV 93.7 01/12/2023   MCH 32.2 01/12/2023   RDW 12.8 01/12/2023   PLT 211 01/12/2023    Lab Results  Component Value Date   TSH 3.38 11/28/2022    Lab Results  Component Value Date   PSA 1.04 03/01/2022    Last vitamin D  No results found for:  MARIEN BOLLS, VD25OH  Lab Results  Component Value Date   BILIRUBINUR NEGATIVE 08/07/2015   PROTEINUR NEGATIVE 08/07/2015   UROBILINOGEN 1.0 08/07/2015   LEUKOCYTESUR SMALL (A) 08/07/2015    No results found for: LABMICR, MICROALBUR   At today's visit, we discussed treatment options, associated risk and benefits, and engage in counseling as needed.  Additionally the following were reviewed: Past medical records, past medical and surgical history, family and social background, as well as relevant laboratory results, imaging findings, and specialty notes, where applicable.  This message was generated using dictation software, and as a result, it may contain unintentional typos or errors.  Nevertheless, extensive effort was made to accurately convey at the pertinent aspects of the patient visit.    There may have been are other unrelated non-urgent complaints, but due to the busy schedule and the amount of time already spent with him, time does not permit to address these issues at today's visit. Another appointment may have or has been requested to review these  additional issues.    Beverley KATHEE Hummer, MD  I,Emily Lagle,acting as a scribe for Beverley KATHEE Hummer, MD.,have documented all relevant documentation on the behalf of Beverley KATHEE Hummer, MD,as directed by  Beverley KATHEE Hummer, MD while in the presence of Beverley KATHEE Hummer, MD.   I, Beverley KATHEE Hummer, MD, have reviewed all documentation for this visit. The documentation on 08/30/2024 for the exam, diagnosis, procedures, and orders are all accurate and complete.

## 2024-08-30 NOTE — Patient Instructions (Addendum)
 It was very nice to see you today!  VISIT SUMMARY: During your visit, we addressed your persistent wheezing and respiratory symptoms, hypertension, hyperlipidemia, sleep disturbances, musculoskeletal pain, and emotional distress. We also discussed general health maintenance and recommended several screenings.  YOUR PLAN: ACUTE BRONCHITIS: You have shown improvement in your symptoms with residual wheezing after completing three days of antibiotics and steroids. -Continue your current course of antibiotics and steroids for three more days.  ESSENTIAL HYPERTENSION: Your blood pressure remains elevated, similar to previous readings. -Start taking amlodipine for blood pressure management. -Monitor your blood pressure at home. -Follow up in one month.  HYPERLIPIDEMIA: Your cholesterol levels have been effectively managed with Crestor . -We have ordered blood work to assess your cholesterol levels.  OBSTRUCTIVE SLEEP APNEA, SUSPECTED: Your reports of snoring and poor sleep quality suggest possible obstructive sleep apnea. -We recommend a home sleep study to evaluate for obstructive sleep apnea.  CHRONIC LOW BACK PAIN AND LEFT HIP OSTEOARTHRITIS: You have chronic low back pain and left hip osteoarthritis with bone-on-bone changes. -Continue taking meloxicam  for pain management. -Start taking gabapentin for muscle relaxation and restless leg symptoms.  RESTLESS LEGS SYNDROME: You experience restless legs syndrome with leg movements and difficulty sleeping. -Start taking gabapentin for restless legs syndrome.  GASTROESOPHAGEAL REFLUX DISEASE (GERD): Your GERD is currently managed with esomeprazole. -Continue taking esomeprazole for GERD management.  GENERAL HEALTH MAINTENANCE: We discussed the importance of routine screenings and lifestyle modifications. -We have ordered blood work including cholesterol, blood sugar, kidney, liver, thyroid, and prostate cancer screening. -We have scheduled a  colonoscopy for comprehensive screening. -We have ordered HIV, hepatitis B, and C screenings.  Return in about 1 month (around 09/29/2024) for BP.   Take care, Arvella Hummer, MD, MS   PLEASE NOTE:  If you had any lab tests, please let us  know if you have not heard back within a few days. You may see your results on mychart before we have a chance to review them but we will give you a call once they are reviewed by us .   If we ordered any referrals today, please let us  know if you have not heard from their office within the next week.   If you had any urgent prescriptions sent in today, please check with the pharmacy within an hour of our visit to make sure the prescription was transmitted appropriately.   Please try these tips to maintain a healthy lifestyle:  Eat at least 3 REAL meals and 1-2 snacks per day.  Aim for no more than 5 hours between eating.  If you eat breakfast, please do so within one hour of getting up.   Each meal should contain half fruits/vegetables, one quarter protein, and one quarter carbs (no bigger than a computer mouse)  Cut down on sweet beverages. This includes juice, soda, and sweet tea.   Drink at least 1 glass of water with each meal and aim for at least 8 glasses per day  Exercise at least 150 minutes every week.

## 2024-08-31 LAB — B12 AND FOLATE PANEL
Folate: 15.1 ng/mL
Vitamin B-12: 263 pg/mL (ref 200–1100)

## 2024-08-31 LAB — TSH RFX ON ABNORMAL TO FREE T4: TSH: 4.39 u[IU]/mL (ref 0.450–4.500)

## 2024-08-31 LAB — APO A1 + B + RATIO
Apolipo. B/A-1 Ratio: 0.9 ratio — ABNORMAL HIGH (ref 0.0–0.7)
Apolipoprotein A-1: 126 mg/dL (ref 101–178)
Apolipoprotein B: 112 mg/dL — ABNORMAL HIGH (ref <90)

## 2024-09-04 ENCOUNTER — Encounter: Payer: Self-pay | Admitting: Family Medicine

## 2024-09-04 ENCOUNTER — Encounter: Admitting: Family Medicine

## 2024-09-04 DIAGNOSIS — N529 Male erectile dysfunction, unspecified: Secondary | ICD-10-CM

## 2024-09-04 DIAGNOSIS — E782 Mixed hyperlipidemia: Secondary | ICD-10-CM | POA: Insufficient documentation

## 2024-09-04 DIAGNOSIS — E538 Deficiency of other specified B group vitamins: Secondary | ICD-10-CM

## 2024-09-04 DIAGNOSIS — E611 Iron deficiency: Secondary | ICD-10-CM

## 2024-09-04 DIAGNOSIS — E1142 Type 2 diabetes mellitus with diabetic polyneuropathy: Secondary | ICD-10-CM

## 2024-09-04 DIAGNOSIS — E785 Hyperlipidemia, unspecified: Secondary | ICD-10-CM | POA: Insufficient documentation

## 2024-09-04 DIAGNOSIS — G2581 Restless legs syndrome: Secondary | ICD-10-CM | POA: Insufficient documentation

## 2024-09-04 DIAGNOSIS — I10 Essential (primary) hypertension: Secondary | ICD-10-CM

## 2024-09-04 DIAGNOSIS — J45909 Unspecified asthma, uncomplicated: Secondary | ICD-10-CM | POA: Insufficient documentation

## 2024-09-08 DIAGNOSIS — E611 Iron deficiency: Secondary | ICD-10-CM | POA: Insufficient documentation

## 2024-09-08 DIAGNOSIS — N529 Male erectile dysfunction, unspecified: Secondary | ICD-10-CM | POA: Insufficient documentation

## 2024-09-08 DIAGNOSIS — E1142 Type 2 diabetes mellitus with diabetic polyneuropathy: Secondary | ICD-10-CM | POA: Insufficient documentation

## 2024-09-08 DIAGNOSIS — E538 Deficiency of other specified B group vitamins: Secondary | ICD-10-CM | POA: Insufficient documentation

## 2024-09-08 MED ORDER — TADALAFIL 5 MG PO TABS: 5.0000 mg | ORAL_TABLET | Freq: Every day | ORAL | 3 refills | Status: DC

## 2024-09-08 MED ORDER — ASPIRIN 81 MG PO TBEC: 81.0000 mg | DELAYED_RELEASE_TABLET | Freq: Every day | ORAL | 3 refills | Status: AC

## 2024-09-08 MED ORDER — POLYSACCH FE COMPLEX-VIT C 18-30 MG PO CHEW: 1.0000 | CHEWABLE_TABLET | Freq: Every day | ORAL | 3 refills | Status: DC

## 2024-09-08 MED ORDER — AMLODIPINE-VALSARTAN-HCTZ 10-320-25 MG PO TABS: 1.0000 | ORAL_TABLET | Freq: Every day | ORAL | 3 refills | Status: AC

## 2024-09-08 MED ORDER — VITAMIN B-12 1000 MCG PO TABS: 1000.0000 ug | ORAL_TABLET | Freq: Every day | ORAL | 3 refills | Status: AC

## 2024-09-08 MED ORDER — ROSUVASTATIN CALCIUM 40 MG PO TABS
40.0000 mg | ORAL_TABLET | Freq: Every day | ORAL | 3 refills | Status: AC
Start: 2024-09-08 — End: ?

## 2024-09-08 MED ORDER — SYNJARDY XR 12.5-1000 MG PO TB24: 2.0000 | ORAL_TABLET | Freq: Every day | ORAL | 3 refills | Status: AC

## 2024-09-08 MED ORDER — FENOFIBRATE 145 MG PO TABS: 145.0000 mg | ORAL_TABLET | Freq: Every day | ORAL | 3 refills | Status: AC

## 2024-09-08 NOTE — Telephone Encounter (Signed)
 Your Lab Results and Plan  Triglycerides very high: High risk for pancreatitis. Stop alcohol/sweets. Eat very low-fat foods. Start fenofibrate 145 mg daily. Go to ER for severe belly pain. Cholesterol: Increase rosuvastatin  to 40 mg daily. New Diabetes (A1c 6.5%): Start Synjardy XR 12.02/999 mg, 2 tablets daily. Referring to nutrition.  Blood pressure: Stop Amlodipine 10 mg. Switch to amlodipine-valsartan-HCTZ 10/320/25 mg daily. Iron low: Start polysaccharide iron + vitamin C chewable daily. Vitamin B12 low: Start cyanocobalamin 1000 mcg daily. ED: Start tadalafil 5 mg daily. BP + Diabetes + Cholesterol = High risk for heart disease:  - start aspirin  81 mg daily (aka baby aspirin ).  - Quit smoking   Please set up lab visit in two weeks fasting for labs.   Pe fe

## 2024-09-10 LAB — URINALYSIS W MICROSCOPIC + REFLEX CULTURE
Bacteria, UA: NONE SEEN /HPF
Bilirubin Urine: NEGATIVE
Glucose, UA: NEGATIVE
Hgb urine dipstick: NEGATIVE
Hyaline Cast: NONE SEEN /LPF
Ketones, ur: NEGATIVE
Leukocyte Esterase: NEGATIVE
Nitrites, Initial: NEGATIVE
Protein, ur: NEGATIVE
RBC / HPF: NONE SEEN /HPF (ref 0–2)
Specific Gravity, Urine: 1.016 (ref 1.001–1.035)
Squamous Epithelial / HPF: NONE SEEN /HPF (ref <=5)
WBC, UA: NONE SEEN /HPF (ref 0–5)
pH: 6 (ref 5.0–8.0)

## 2024-09-10 LAB — CBC WITH DIFFERENTIAL/PLATELET
Absolute Lymphocytes: 2639 {cells}/uL (ref 850–3900)
Absolute Monocytes: 837 {cells}/uL (ref 200–950)
Basophils Absolute: 82 {cells}/uL (ref 0–200)
Basophils Relative: 0.9 %
Eosinophils Absolute: 146 {cells}/uL (ref 15–500)
Eosinophils Relative: 1.6 %
HCT: 42.2 % (ref 38.5–50.0)
Hemoglobin: 14.2 g/dL (ref 13.2–17.1)
MCH: 32 pg (ref 27.0–33.0)
MCHC: 33.6 g/dL (ref 32.0–36.0)
MCV: 95 fL (ref 80.0–100.0)
MPV: 13 fL — ABNORMAL HIGH (ref 7.5–12.5)
Monocytes Relative: 9.2 %
Neutro Abs: 5396 {cells}/uL (ref 1500–7800)
Neutrophils Relative %: 59.3 %
Platelets: 186 Thousand/uL (ref 140–400)
RBC: 4.44 Million/uL (ref 4.20–5.80)
RDW: 13.1 % (ref 11.0–15.0)
Total Lymphocyte: 29 %
WBC: 9.1 Thousand/uL (ref 3.8–10.8)

## 2024-09-10 LAB — HEMOGLOBIN A1C
Hgb A1c MFr Bld: 6.5 % — ABNORMAL HIGH (ref <5.7)
Mean Plasma Glucose: 140 mg/dL
eAG (mmol/L): 7.7 mmol/L

## 2024-09-10 LAB — IRON,TIBC AND FERRITIN PANEL
%SAT: 19 % — ABNORMAL LOW (ref 20–48)
Ferritin: 29 ng/mL — ABNORMAL LOW (ref 38–380)
Iron: 71 ug/dL (ref 50–180)
TIBC: 368 ug/dL (ref 250–425)

## 2024-09-10 LAB — COMPREHENSIVE METABOLIC PANEL WITH GFR
AG Ratio: 2.2 (calc) (ref 1.0–2.5)
ALT: 43 U/L (ref 9–46)
AST: 23 U/L (ref 10–35)
Albumin: 4.2 g/dL (ref 3.6–5.1)
Alkaline phosphatase (APISO): 42 U/L (ref 35–144)
BUN: 12 mg/dL (ref 7–25)
CO2: 27 mmol/L (ref 20–32)
Calcium: 9 mg/dL (ref 8.6–10.3)
Chloride: 103 mmol/L (ref 98–110)
Creat: 0.88 mg/dL (ref 0.70–1.30)
Globulin: 1.9 g/dL (ref 1.9–3.7)
Glucose, Bld: 97 mg/dL (ref 65–99)
Potassium: 4.2 mmol/L (ref 3.5–5.3)
Sodium: 137 mmol/L (ref 135–146)
Total Bilirubin: 0.3 mg/dL (ref 0.2–1.2)
Total Protein: 6.1 g/dL (ref 6.1–8.1)
eGFR: 102 mL/min/1.73m2 (ref >=60)

## 2024-09-10 LAB — VITAMIN D 1,25 DIHYDROXY
Vitamin D 1, 25 (OH)2 Total: 25 pg/mL (ref 18–72)
Vitamin D2 1, 25 (OH)2: <8 pg/mL
Vitamin D3 1, 25 (OH)2: 25 pg/mL

## 2024-09-10 LAB — LIPID PANEL
Cholesterol: 219 mg/dL — ABNORMAL HIGH (ref <200)
HDL: 34 mg/dL — ABNORMAL LOW (ref >=40)
Non-HDL Cholesterol (Calc): 185 mg/dL — ABNORMAL HIGH (ref <130)
Total CHOL/HDL Ratio: 6.4 (calc) — ABNORMAL HIGH (ref <5.0)
Triglycerides: 594 mg/dL — ABNORMAL HIGH (ref <150)

## 2024-09-10 LAB — INSULIN, RANDOM: Insulin: 42.1 u[IU]/mL — ABNORMAL HIGH

## 2024-09-10 LAB — MICROALBUMIN / CREATININE URINE RATIO
Creatinine, Urine: 132 mg/dL (ref 20–320)
Microalb Creat Ratio: 2 mg/g{creat} (ref <30)
Microalb, Ur: 0.2 mg/dL

## 2024-09-10 LAB — C-PEPTIDE: C-Peptide: 5.76 ng/mL — ABNORMAL HIGH (ref 0.80–3.85)

## 2024-09-10 LAB — HIGH SENSITIVITY CRP: hs-CRP: 2.8 mg/L

## 2024-09-10 LAB — PSA: PSA: 0.96 ng/mL (ref <=4.00)

## 2024-09-10 LAB — HIV ANTIBODY (ROUTINE TESTING W REFLEX)
HIV 1&2 Ab, 4th Generation: NONREACTIVE
HIV FINAL INTERPRETATION: NEGATIVE

## 2024-09-10 LAB — HEPATITIS C ANTIBODY: Hepatitis C Ab: NONREACTIVE

## 2024-09-10 LAB — NO CULTURE INDICATED

## 2024-09-13 ENCOUNTER — Other Ambulatory Visit (HOSPITAL_COMMUNITY): Payer: Self-pay

## 2024-09-13 ENCOUNTER — Telehealth: Payer: Self-pay

## 2024-09-13 NOTE — Telephone Encounter (Signed)
 Pharmacy Patient Advocate Encounter   Received notification from Onbase that prior authorization for Tadalafil 5MG  tablets is required/requested.   Insurance verification completed.   The patient is insured through [facility].   Per test claim: PA required; PA submitted to above mentioned insurance via Latent Key/confirmation #/[id number] Status is pending

## 2024-09-17 NOTE — Telephone Encounter (Signed)
 Pharmacy Patient Advocate Encounter  Received notification from [employer] that Prior Authorization for Tadalafil 5MG  tablets  has been DENIED.  See denial reason below. No denial letter attached in CMM. Will attach denial letter to Media tab once received.   PA #/Case ID/Reference #: [id number]  [facility] reviewed your TADALAFIL 5 MG TABLET request for the above-identified member, and IT IS DENIED FOR THE FOLLOWING REASON: BECAUSE WE DID NOT SEE WHAT WE NEED TO APPROVE THE DRUG YOU ASKED FOR, (TADALAFIL). This request tells us  your doctor asked for this drug for your illness (erectile dysfunction). We may be able to approve this drug for a certain illness (benign prostatic hyperplasia). If you do have this illness, we may need more information. We based this decision on your health plan's prior authorization clinical criteria named Cialis

## 2024-09-20 MED ORDER — TADALAFIL 5 MG PO TABS: 5.0000 mg | ORAL_TABLET | Freq: Every day | ORAL | 11 refills | Status: DC

## 2024-09-20 NOTE — Addendum Note (Signed)
 Addended by: SEBASTIAN RIGHTER B on: 09/20/2024 04:57 PM   Modules accepted: Orders

## 2024-09-23 NOTE — Telephone Encounter (Signed)
 Noted

## 2024-10-03 ENCOUNTER — Encounter

## 2024-10-04 ENCOUNTER — Ambulatory Visit: Admitting: Family Medicine

## 2024-10-10 ENCOUNTER — Encounter

## 2024-10-17 ENCOUNTER — Encounter

## 2024-10-29 ENCOUNTER — Other Ambulatory Visit: Payer: Self-pay | Admitting: Family Medicine

## 2024-10-29 DIAGNOSIS — J4521 Mild intermittent asthma with (acute) exacerbation: Secondary | ICD-10-CM

## 2024-10-29 NOTE — Telephone Encounter (Unsigned)
 Copied from CRM [id number]. Topic: Clinical - Medication Refill >> Oct 29, 2024 11:54 AM Ahlexyia S wrote: Medication: albuterol  (VENTOLIN  HFA) 108 (90 Base) MCG/ACT inhaler  Has the patient contacted their pharmacy? Yes, pt was told by pharmacy that he needs a new prescription.  (Agent: If no, request that the patient contact the pharmacy for the refill. If patient does not wish to contact the pharmacy document the reason why and proceed with request.) (Agent: If yes, when and what did the pharmacy advise?)  This is the patient's preferred pharmacy:  [facility] [phone] [facility], [place] - [address] [place] [address] Phone: [phone] Fax: [phone]  Is this the correct pharmacy for this prescription? Yes If no, delete pharmacy and type the correct one.   Has the prescription been filled recently? No  Is the patient out of the medication? Yes  Has the patient been seen for an appointment in the last year OR does the patient have an upcoming appointment? Yes  Can we respond through MyChart? No  Agent: Please be advised that Rx refills may take up to 3 business days. We ask that you follow-up with your pharmacy.

## 2024-10-30 MED ORDER — ALBUTEROL SULFATE HFA 108 (90 BASE) MCG/ACT IN AERS: 2.0000 | INHALATION_SPRAY | Freq: Four times a day (QID) | RESPIRATORY_TRACT | 0 refills | Status: AC | PRN

## 2024-11-05 ENCOUNTER — Ambulatory Visit: Admitting: Family Medicine

## 2024-11-05 ENCOUNTER — Encounter: Payer: Self-pay | Admitting: Family Medicine

## 2024-11-05 VITALS — BP 165/74 | HR 88 | Temp 98.2°F | Ht 71.0 in | Wt 258.2 lb

## 2024-11-05 DIAGNOSIS — E611 Iron deficiency: Secondary | ICD-10-CM | POA: Diagnosis not present

## 2024-11-05 DIAGNOSIS — I10 Essential (primary) hypertension: Secondary | ICD-10-CM | POA: Diagnosis not present

## 2024-11-05 DIAGNOSIS — M1612 Unilateral primary osteoarthritis, left hip: Secondary | ICD-10-CM | POA: Diagnosis not present

## 2024-11-05 DIAGNOSIS — Z7985 Long-term (current) use of injectable non-insulin antidiabetic drugs: Secondary | ICD-10-CM

## 2024-11-05 DIAGNOSIS — Z23 Encounter for immunization: Secondary | ICD-10-CM | POA: Diagnosis not present

## 2024-11-05 DIAGNOSIS — E1142 Type 2 diabetes mellitus with diabetic polyneuropathy: Secondary | ICD-10-CM

## 2024-11-05 DIAGNOSIS — E538 Deficiency of other specified B group vitamins: Secondary | ICD-10-CM | POA: Diagnosis not present

## 2024-11-05 DIAGNOSIS — N5203 Combined arterial insufficiency and corporo-venous occlusive erectile dysfunction: Secondary | ICD-10-CM | POA: Diagnosis not present

## 2024-11-05 MED ORDER — SEMAGLUTIDE(0.25 OR 0.5MG/DOS) 2 MG/3ML ~~LOC~~ SOPN: PEN_INJECTOR | SUBCUTANEOUS | 0 refills | Status: AC

## 2024-11-05 MED ORDER — RA BLOOD PRESSURE CUFF MONITOR DEVI: 1.0000 | Freq: Every day | 0 refills | Status: AC

## 2024-11-05 MED ORDER — TADALAFIL 20 MG PO TABS: 20.0000 mg | ORAL_TABLET | Freq: Every day | ORAL | 3 refills | Status: AC | PRN

## 2024-11-05 MED ORDER — MELOXICAM 15 MG PO TABS: 15.0000 mg | ORAL_TABLET | Freq: Every day | ORAL | 3 refills | Status: AC

## 2024-11-05 MED ORDER — FERRIC MALTOL 30 MG PO CAPS: ORAL_CAPSULE | ORAL | 3 refills | Status: AC

## 2024-11-05 NOTE — Progress Notes (Signed)
 " Assessment & Plan   Assessment/Plan:   Assessment and Plan Assessment & Plan Type 2 diabetes mellitus with diabetic polyneuropathy Type 2 diabetes with diabetic polyneuropathy, currently managed with empagliflozin-metformin (Synjardy  XR). Blood sugar control is not adequate, possibly due to recent steroid use. No current use of insulin . Neuropathy managed with gabapentin . - Started semaglutide  0.25 mg injection subcutaneously weekly for 14 days, then increased to 0.5 mg dose. - Continue empagliflozin-metformin (Synjardy  XR) 12.02-999 mg twice a day. - Continue gabapentin  300 mg TID for neuropathy.  Primary hypertension Blood pressure elevated at 165/74, possibly due to recent illness and steroid use. Current management includes amlodipine -valsartan -hydrochlorothiazide. - Continue amlodipine -valsartan -hydrochlorothiazide 10-320-25 mg. - Recheck renal function w/ CMP - Send Rx for blood pressure cuff  Obesity BMI of 36. Weight management is challenging despite lifestyle modifications. - Continue healthy diet and exercise - Monitor weight  Primary osteoarthritis of left hip Chronic left hip arthritis with increasing pain. Previous use of meloxicam  for pain management. - Refilled meloxicam  15 mg tablet daily as needed for pain management.  Combined arterial insufficiency and corporo-venous occlusive erectile dysfunction Erectile dysfunction managed with tadalafil . Medicaid denied coverage for 5 mg dose, but 20 mg dose is available. - Prescribed tadalafil  20 mg daily as needed for ED, with option to split dose to 5 mg daily daily if preferred.  Iron deficiency Previous low ferritin levels noted. No current anemia. - Prescribed ferric manatol 30 mg BID  B12 deficiency Previous borderline B12 levels noted. No current anemia. - Check MMA and Homocystine   Hyperlipidemia Managed with rosuvastatin  and fenofibrate . - Continue rosuvastatin  40 mg and fenofibrate  145 mg  daily.  Suspected sleep apnea Previous referral for sleep study not completed. Reports daytime fatigue. - Provided information for scheduling sleep study with [facility].        Medications Discontinued During This Encounter  Medication Reason   meloxicam  (MOBIC ) 15 MG tablet Reorder   Polysacch Fe Complex-Vit C 18-30 MG CHEW    tadalafil  (CIALIS ) 5 MG tablet    tadalafil  (CIALIS ) 5 MG tablet     Return in about 1 month (around 12/06/2024).        Subjective:   Encounter date: 11/05/2024  Joel [name] is a 54 y.o. male who has Hypertension; Chronic right-sided low back pain with right-sided sciatica; Anxiety; Chronic pain of left ankle; Tobacco use; Obesity (BMI 30-39.9); Gastroesophageal reflux disease; Status post total replacement of right hip; Restless leg syndrome; Hypercholesterolemia with hyperglyceridemia; Reactive airway disease; B12 deficiency; Type 2 diabetes mellitus with diabetic polyneuropathy, without long-term current use of insulin  (HCC); Vasculogenic erectile dysfunction; and Iron deficiency on their problem list..   He  has a past medical history of Arthritis, GERD (gastroesophageal reflux disease), High cholesterol, History of kidney stones, and Infection of index finger.Joel [name]   He presents with chief complaint of Medical Management of Chronic Issues (1 mon f/u for BP. Pt states he has been sick and out of work since the 12th, had COVID for 2 1/2 weeks. Pt needs a BP Monitor ) .  Discussed the use of AI scribe software for clinical note transcription with the patient, who gave verbal consent to proceed.  History of Present Illness Joel [name] is a 54 year old male who presents for follow-up after a recent COVID-19 infection.  Covid-19 infection - Recent COVID-19 infection lasting six to eight days - Symptoms included vomiting, persistent cough, and loss of taste - This was the third COVID-19 infection in  two years - Current  episode was less severe than previous infections - Self-administered leftover antibiotics from a previous prescription - Family history of COVID-19 complications, including loss of father in 2020  Hypertension - Chronic hypertension managed with amlodipine , valsartan , and hydrochlorothiazide - No mention of current symptoms related to hypertension - Very ill during last visit and treated with steroids at that time  Type 2 diabetes mellitus - Managed with empagliflozin with metformin (Synjardy  XR), taken twice daily - Good adherence to medication regimen, with only two missed doses - No side effects from medication - No increased urination  Chronic left hip pain - Chronic left hip pain secondary to arthritis - Planning for hip surgery later in the year - Uses meloxicam  for pain management - Meloxicam  prescription has not been refilled recently  Erectile dysfunction - Erectile dysfunction managed with tadalafil  as needed - Considering dosage adjustment due to availability issues with current prescription  Anemia and nutritional deficiencies - History of anemia with low iron and borderline B12 levels - No prescription for iron supplements despite previous discussions  Weight management and lifestyle modification - Difficulty losing weight despite dietary changes - Has quit sodas and reduced bread and potato intake - Reduced cigarette smoking to about five per day - Has quit marijuana use  Caregiver stress - 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, which is described as challenging   ROS  Past Surgical History:  Procedure Laterality Date   ELBOW BURSA SURGERY  2011   Right   I & D EXTREMITY  03/08/2012   Procedure: IRRIGATION AND DEBRIDEMENT EXTREMITY;  Surgeon: Jerona LULLA Sage, MD;  Location: [facility] OR;  Service: Orthopedics;  Laterality: Left;  Left Index Finger Irrigation and Debridement, Place Antibiotic Beads   neck cyst removed     at age 74   TOTAL HIP ARTHROPLASTY Right 01/17/2023    Procedure: RIGHT TOTAL HIP ARTHROPLASTY ANTERIOR APPROACH;  Surgeon: Vernetta Lonni GRADE, MD;  Location[facility] OR;  Service: Orthopedics;  Laterality: Right;    Medications Ordered Prior to Encounter[1]  No family history on file.  Social History   Socioeconomic History   Marital status: Divorced    Spouse name: Not on file   Number of children: Not on file   Years of education: Not on file   Highest education level: 12th grade  Occupational History   Not on file  Tobacco Use   Smoking status: Every Day    Current packs/day: 0.50    Average packs/day: 0.5 packs/day for 6.0 years (3.0 ttl pk-yrs)    Types: Cigarettes    Passive exposure: Yes   Smokeless tobacco: Former    Types: Chew   Tobacco comments:    Pt smokes 1/2 pack per day   Vaping Use   Vaping status: Never Used  Substance and Sexual Activity   Alcohol use: Yes    Comment: occasional   Drug use: No   Sexual activity: Yes    Birth control/protection: None  Other Topics Concern   Not on file  Social History Narrative   Not on file   Social Drivers of Health   Tobacco Use: High Risk (11/05/2024)   Patient History    Smoking Tobacco Use: Every Day    Smokeless Tobacco Use: Former    Passive Exposure: Yes  Physicist, Medical Strain: Low Risk (08/30/2024)   Overall Financial Resource Strain (CARDIA)    Difficulty of Paying Living Expenses: Not very hard  Food Insecurity: Unknown (08/30/2024)   Epic  Worried About Programme Researcher, Broadcasting/film/video in the Last Year: Never true    [employer] of Food in the Last Year: Patient declined  Transportation Needs: No Transportation Needs (08/30/2024)   Epic    Lack of Transportation (Medical): No    Lack of Transportation (Non-Medical): No  Physical Activity: Not on file  Stress: No Stress Concern Present (08/30/2024)   [employer] of Occupational Health - Occupational Stress Questionnaire    Feeling of Stress: Only a little  Social Connections: Moderately Integrated  (08/30/2024)   Social Connection and Isolation Panel    Frequency of Communication with Friends and Family: More than three times a week    Frequency of Social Gatherings with Friends and Family: Once a week    Attends Religious Services: More than 4 times per year    Active Member of [employer] or Organizations: Yes    Attends Banker Meetings: More than 4 times per year    Marital Status: Divorced  Intimate Partner Violence: Not on file  Depression (PHQ2-9): Medium Risk (08/30/2024)   Depression (PHQ2-9)    PHQ-2 Score: 6  Alcohol Screen: Not on file  Housing: Low Risk (08/30/2024)   Epic    Unable to Pay for Housing in the Last Year: No    Number of Times Moved in the Last Year: 0    Homeless in the Last Year: No  Utilities: Not on file  Health Literacy: Not on file                                                                                                  Objective:  Physical Exam: BP (!) 165/74   Pulse 88   Temp 98.2 F (36.8 C)   Ht 5' 11 (1.803 m)   Wt 258 lb 3.2 oz (117.1 kg)   SpO2 99%   BMI 36.01 kg/m    Physical Exam           Physical Exam Constitutional:      General: He is not in acute distress.    Appearance: Normal appearance. He is not ill-appearing or toxic-appearing.  HENT:     Head: Normocephalic and atraumatic.     Right Ear: Hearing, tympanic membrane, ear canal and external ear normal. There is no impacted cerumen.     Left Ear: Hearing, tympanic membrane, ear canal and external ear normal. There is no impacted cerumen.     Nose: Nose normal. No congestion.     Mouth/Throat:     Lips: No lesions.     Mouth: Mucous membranes are moist.     Pharynx: Oropharynx is clear. No oropharyngeal exudate.  Eyes:     General: Lids are normal. Vision grossly intact. Gaze aligned appropriately. No scleral icterus.       Right eye: No discharge.        Left eye: No discharge.     Extraocular Movements: Extraocular movements intact.      Right eye: Normal extraocular motion.     Left eye: Normal extraocular motion.     Conjunctiva/sclera: Conjunctivae normal.  Pupils: Pupils are equal, round, and reactive to light.     Visual Fields: Right eye visual fields normal and left eye visual fields normal.  Neck:     Thyroid: No thyroid mass, thyromegaly or thyroid tenderness.  Cardiovascular:     Rate and Rhythm: Normal rate and regular rhythm.     Pulses: Normal pulses.     Heart sounds: Normal heart sounds.  Pulmonary:     Effort: Pulmonary effort is normal. No respiratory distress.     Breath sounds: Normal breath sounds.  Abdominal:     General: Abdomen is flat. Bowel sounds are normal.     Palpations: Abdomen is soft.     Tenderness: There is no abdominal tenderness.  Musculoskeletal:        General: Normal range of motion.     Cervical back: Normal range of motion.     Right lower leg: No edema.     Left lower leg: No edema.  Lymphadenopathy:     Cervical: No cervical adenopathy.  Skin:    General: Skin is warm and dry.     Findings: No rash.  Neurological:     General: No focal deficit present.     Mental Status: He is alert and oriented to person, place, and time. Mental status is at baseline.     Cranial Nerves: No cranial nerve deficit.     Deep Tendon Reflexes:     Reflex Scores:      Patellar reflexes are 2+ on the right side and 2+ on the left side. Psychiatric:        Mood and Affect: Mood normal.        Behavior: Behavior normal.        Thought Content: Thought content normal.        Judgment: Judgment normal.     No results found.  Recent Results (from the past 2160 hours)  POCT Influenza A/B     Status: Normal   Collection Time: 08/16/24  1:49 PM  Result Value Ref Range   Influenza A, POC Negative Negative   Influenza B, POC Negative Negative  POC COVID-19 BinaxNow     Status: Normal   Collection Time: 08/16/24  1:49 PM  Result Value Ref Range   SARS Coronavirus 2 Ag Negative  Negative  Hepatitis C Antibody     Status: None   Collection Time: 08/30/24  4:57 PM  Result Value Ref Range   Hepatitis C Ab NON-REACTIVE NON-REACTIVE    Comment: . HCV antibody was non-reactive. There is no laboratory  evidence of HCV infection. . In most cases, no further action is required. However, if recent HCV exposure is suspected, a test for HCV RNA (test code 64354) is suggested. . For additional information please refer to http://education.[facility].com/faq/FAQ22v1 (This link is being provided for informational/ educational purposes only.) .   HIV Antibody (routine testing w rflx)     Status: None   Collection Time: 08/30/24  4:57 PM  Result Value Ref Range   HIV FINAL INTERPRETATION HIV NEGATIVE     Comment: HIV-1 antigen and HIV-1/HIV-2 antibodies were not detected. There is no laboratory evidence of HIV infection.    HIV 1&2 Ab, 4th Generation NON-REACTIVE NON-REACTIVE  Iron, TIBC and Ferritin Panel     Status: Abnormal   Collection Time: 08/30/24  4:57 PM  Result Value Ref Range   Iron 71 50 - 180 mcg/dL   TIBC 631 749 - 574 mcg/dL (calc)   %  SAT 19 (L) 20 - 48 % (calc)   Ferritin 29 (L) 38 - 380 ng/mL  PSA     Status: None   Collection Time: 08/30/24  4:57 PM  Result Value Ref Range   PSA 0.96 < OR = 4.00 ng/mL    Comment: The total PSA value from this assay system is  standardized against the WHO standard. The test  result will be approximately 20% lower when compared  to the equimolar-standardized total PSA (Beckman  Coulter). Comparison of serial PSA results should be  interpreted with this fact in mind. . This test was performed using the Siemens  chemiluminescent method. Values obtained from  different assay methods cannot be used interchangeably. PSA levels, regardless of value, should not be interpreted as absolute evidence of the presence or absence of disease.   TSH Rfx on Abnormal to Free T4     Status: None   Collection Time:  08/30/24  4:57 PM  Result Value Ref Range   TSH 4.390 0.450 - 4.500 uIU/mL  Insulin , random     Status: Abnormal   Collection Time: 08/30/24  4:57 PM  Result Value Ref Range   Insulin  42.1 (H) uIU/mL    Comment:       Reference Range  < or = 18.4 .       Risk:       Optimal          < or = 18.4       Moderate         NA       High             >18.4 .       Adult cardiovascular event risk category       cut points (optimal, moderate, high)       are based on Insulin  Reference Interval       studies performed at [facility]       in 2022. Joel [name]   CRP High sensitivity     Status: None   Collection Time: 08/30/24  4:57 PM  Result Value Ref Range   hs-CRP 2.8 mg/L    Comment: Reference Range Optimal <1.0 Jellinger PS et al. Gaylene Pract.2017;23(Suppl 2):1-87. For ages >33 Years: hs-CRP mg/L  Risk According to AHA/CDC Guidelines <1.0         Lower relative cardiovascular risk. 1.0-3.0      Average relative cardiovascular risk. 3.1-10.0     Higher relative cardiovascular risk.              Consider retesting in 1 to 2 weeks to              exclude a benign transient elevation              in the baseline CRP value secondary              to infection or inflammation. >10.0        Persistent elevation, upon retesting,              may be associated with infection and              inflammation. Joel [name] Sierra TA, Mensah GA, Alexander RW, et al. Markers of inflammation and cardiovascular disease:   application to clinical and public health practice: A statement for healthcare professionals from the  Centers for Disease Control and Prevention and the  American Heart Association. Circulation 2003; 107(3): L7973251.  Apo A1 + B + Ratio     Status: Abnormal   Collection Time: 08/30/24  4:57 PM  Result Value Ref Range   Apolipoprotein A-1 126 101 - 178 mg/dL   Apolipoprotein B 887 (H) <90 mg/dL    Comment:                          Desirable               < 90                           Borderline High     90 -  99                          High               100 - 130                          Very High               >130      --------------------------------------------------           ASCVD RISK              THERAPEUTIC TARGET            CATEGORY                  APO B (mg/dL)         Very High Risk        <80 (if extreme risk <70)         High Risk             <90         Moderate Risk         <90    Apolipo. B/A-1 Ratio 0.9 (H) 0.0 - 0.7 ratio    Comment:                              Apolipoprotein B/A-1 Ratio                                            Male  Male                                   Avg.Risk  0.7   0.6                                2X Avg.Risk  0.9   0.9                                3X Avg.Risk  1.0   1.0   C-peptide     Status: Abnormal   Collection Time: 08/30/24  4:57 PM  Result Value Ref Range   C-Peptide 5.76 (H) 0.80 - 3.85 ng/mL  Urinalysis w microscopic + reflex cultur     Status: None   Collection Time: 08/30/24  4:57  PM   Specimen: Serum  Result Value Ref Range   Color, Urine YELLOW YELLOW   APPearance CLEAR CLEAR   Specific Gravity, Urine 1.016 1.001 - 1.035   pH 6.0 5.0 - 8.0   Glucose, UA NEGATIVE NEGATIVE   Bilirubin Urine NEGATIVE NEGATIVE   Ketones, ur NEGATIVE NEGATIVE   Hgb urine dipstick NEGATIVE NEGATIVE   Protein, ur NEGATIVE NEGATIVE   Nitrites, Initial NEGATIVE NEGATIVE   Leukocyte Esterase NEGATIVE NEGATIVE   WBC, UA NONE SEEN 0 - 5 /HPF   RBC / HPF NONE SEEN 0 - 2 /HPF   Squamous Epithelial / HPF NONE SEEN < OR = 5 /HPF   Bacteria, UA NONE SEEN NONE SEEN /HPF   Hyaline Cast NONE SEEN NONE SEEN /LPF  Comprehensive metabolic panel with GFR     Status: None   Collection Time: 08/30/24  4:57 PM  Result Value Ref Range   Glucose, Bld 97 65 - 99 mg/dL    Comment: .            Fasting reference interval .    BUN 12 7 - 25 mg/dL   Creat 9.11 9.29 - 8.69 mg/dL   eGFR 897 > OR = 60 fO/fpw/8.26f7    BUN/Creatinine Ratio SEE NOTE: 6 - 22 (calc)    Comment:    Not Reported: BUN and Creatinine are within    reference range. .    Sodium 137 135 - 146 mmol/L   Potassium 4.2 3.5 - 5.3 mmol/L   Chloride 103 98 - 110 mmol/L   CO2 27 20 - 32 mmol/L   Calcium  9.0 8.6 - 10.3 mg/dL   Total Protein 6.1 6.1 - 8.1 g/dL   Albumin 4.2 3.6 - 5.1 g/dL   Globulin 1.9 1.9 - 3.7 g/dL (calc)   AG Ratio 2.2 1.0 - 2.5 (calc)   Total Bilirubin 0.3 0.2 - 1.2 mg/dL   Alkaline phosphatase (APISO) 42 35 - 144 U/L   AST 23 10 - 35 U/L   ALT 43 9 - 46 U/L  CBC with Differential/Platelet     Status: Abnormal   Collection Time: 08/30/24  4:57 PM  Result Value Ref Range   WBC 9.1 3.8 - 10.8 Thousand/uL   RBC 4.44 4.20 - 5.80 Million/uL   Hemoglobin 14.2 13.2 - 17.1 g/dL   HCT 57.7 61.4 - 49.9 %   MCV 95.0 80.0 - 100.0 fL   MCH 32.0 27.0 - 33.0 pg   MCHC 33.6 32.0 - 36.0 g/dL    Comment: For adults, a slight decrease in the calculated MCHC value (in the range of 30 to 32 g/dL) is most likely not clinically significant; however, it should be interpreted with caution in correlation with other red cell parameters and the patient's clinical condition.    RDW 13.1 11.0 - 15.0 %   Platelets 186 140 - 400 Thousand/uL   MPV 13.0 (H) 7.5 - 12.5 fL   Neutro Abs 5,396 1,500 - 7,800 cells/uL   Absolute Lymphocytes 2,639 850 - 3,900 cells/uL   Absolute Monocytes 837 200 - 950 cells/uL   Eosinophils Absolute 146 15 - 500 cells/uL   Basophils Absolute 82 0 - 200 cells/uL   Neutrophils Relative % 59.3 %   Total Lymphocyte 29.0 %   Monocytes Relative 9.2 %   Eosinophils Relative 1.6 %   Basophils Relative 0.9 %  Vitamin D  1,25 dihydroxy     Status: None   Collection Time: 08/30/24  4:57  PM  Result Value Ref Range   Vitamin D  1, 25 (OH)2 Total 25 18 - 72 pg/mL   Vitamin D3 1, 25 (OH)2 25 pg/mL   Vitamin D2 1, 25 (OH)2 <8 pg/mL    Comment: (Note) Vitamin D3, 1,25(OH)2 indicates both endogenous  production and  supplementation. Vitamin D2, 1,25(OH)2 is  an indicator of exogenous sources, such as diet or  supplementation. Interpretation and therapy are based on  measurement of Vitamin D , 1,25 (OH)2, Total. . This test was developed, and its analytical performance  characteristics have been determined by [employer]. It has not been cleared or approved by the  FDA. This assay has been validated pursuant to the CLIA  regulations and is used for clinical purposes. . For additional information, please refer to http://education.[facility].com/faq/FAQ199 (This link is being provided for  informational/educational purposes only.) . [facility] [address] [place] [address] [phone] Johanna Agent L. Gino, MD, PhD   Microalbumin / creatinine urine ratio     Status: None   Collection Time: 08/30/24  4:57 PM  Result Value Ref Range   Creatinine, Urine 132 20 - 320 mg/dL   Microalb, Ur 0.2 mg/dL    Comment: Reference Range Not established    Microalb Creat Ratio 2 <30 mg/g creat    Comment: NOTE: The urine albumin value is less than  0.2 mg/dL therefore we are unable to calculate  excretion and/or creatinine ratio. . The ADA defines abnormalities in albumin excretion as follows: Joel [name] Albuminuria Category        Result (mg/g creatinine) . Normal to Mildly increased   <30 Moderately increased         30-299  Severely increased           > OR = 300 . The ADA recommends that at least two of three specimens collected within a 3-6 month period be abnormal before considering a patient to be within a diagnostic category.   Hemoglobin A1c     Status: Abnormal   Collection Time: 08/30/24  4:57 PM  Result Value Ref Range   Hgb A1c MFr Bld 6.5 (H) <5.7 %    Comment: For someone without known diabetes, a hemoglobin A1c value of 6.5% or greater indicates that they may have  diabetes and this should be confirmed with a follow-up  test. . For  someone with known diabetes, a value <7% indicates  that their diabetes is well controlled and a value  greater than or equal to 7% indicates suboptimal  control. A1c targets should be individualized based on  duration of diabetes, age, comorbid conditions, and  other considerations. . Currently, no consensus exists regarding use of hemoglobin A1c for diagnosis of diabetes for children. .    Mean Plasma Glucose 140 mg/dL   eAG (mmol/L) 7.7 mmol/L  Lipid panel     Status: Abnormal   Collection Time: 08/30/24  4:57 PM  Result Value Ref Range   Cholesterol 219 (H) <200 mg/dL   HDL 34 (L) > OR = 40 mg/dL   Triglycerides 405 (H) <150 mg/dL    Comment: . If a non-fasting specimen was collected, consider repeat triglyceride testing on a fasting specimen if clinically indicated.  Veatrice et al. J. of Clin. Lipidol. 2015;9:129-169. . . There is increased risk of pancreatitis when the  triglyceride concentration is very high  (> or = 500 mg/dL, especially if > or = 8999 mg/dL).  Veatrice et al. JINNY. of  Clin. Lipidol. 2015;9:129-169. Joel [name]    LDL Cholesterol (Calc)  mg/dL (calc)    Comment: . LDL cholesterol not calculated. Triglyceride levels greater than 400 mg/dL invalidate calculated LDL results. . Reference range: <100 . Desirable range <100 mg/dL for primary prevention;   <70 mg/dL for patients with CHD or diabetic patients  with > or = 2 CHD risk factors. Joel [name] LDL-C is now calculated using the Martin-Hopkins  calculation, which is a validated novel method providing  better accuracy than the Friedewald equation in the  estimation of LDL-C.  Gladis APPLETHWAITE et al. SANDREA. 7986;689(80): 2061-2068  (http://education.[facility].com/faq/FAQ164)    Total CHOL/HDL Ratio 6.4 (H) <5.0 (calc)   Non-HDL Cholesterol (Calc) 185 (H) <130 mg/dL (calc)    Comment: For patients with diabetes plus 1 major ASCVD risk  factor, treating to a non-HDL-C goal of <100 mg/dL  (LDL-C of <29 mg/dL) is  considered a therapeutic  option.   REFLEXIVE URINE CULTURE     Status: None   Collection Time: 08/30/24  4:57 PM  Result Value Ref Range   Reflexve Urine Culture      Comment: NO CULTURE INDICATED  B12 and Folate Panel     Status: None   Collection Time: 08/30/24  5:05 PM  Result Value Ref Range   Vitamin B-12 263 200 - 1,100 pg/mL    Comment: . Please Note: Although the reference range for vitamin B12 is [phone] pg/mL, it has been reported that between 5 and 10% of patients with values between 200 and 400 pg/mL may experience neuropsychiatric and hematologic abnormalities due to occult B12 deficiency; less than 1% of patients with values above 400 pg/mL will have symptoms. .    Folate 15.1 ng/mL    Comment:                            Reference Range                            Low:           <3.4                            Borderline:    3.4-5.4                            Normal:        >5.4 .         Beverley KATHEE Hummer, MD  I,Emily Lagle,acting as a scribe for Beverley KATHEE Hummer, MD.,have documented all relevant documentation on the behalf of Beverley KATHEE Hummer, MD.  LILLETTE Beverley KATHEE Hummer, MD, have reviewed all documentation for this visit. The documentation on 11/05/2024 for the exam, diagnosis, procedures, and orders are all accurate and complete.     [1]  Current Outpatient Medications on File Prior to Visit  Medication Sig Dispense Refill   albuterol  (VENTOLIN  HFA) 108 (90 Base) MCG/ACT inhaler Inhale 2 puffs into the lungs every 6 (six) hours as needed for wheezing or shortness of breath. 8 g 0   amLODIPine -Valsartan -HCTZ 10-320-25 MG TABS Take 1 tablet by mouth daily in the afternoon. 90 tablet 3   aspirin  EC 81 MG tablet Take 1 tablet (81 mg total) by mouth daily. Swallow whole. 90 tablet 3   Empagliflozin-metFORMIN HCl ER (SYNJARDY  XR) 12.02-999 MG TB24  Take 2 tablets by mouth daily. 180 tablet 3   esomeprazole (NEXIUM) 40 MG capsule Take 1 capsule (40 mg total) by  mouth daily. 30 capsule 3   fenofibrate  (TRICOR ) 145 MG tablet Take 1 tablet (145 mg total) by mouth daily. 90 tablet 3   gabapentin  (NEURONTIN ) 300 MG capsule Take 1 capsule (300 mg total) by mouth 3 (three) times daily. 270 capsule 3   rosuvastatin  (CRESTOR ) 40 MG tablet Take 1 tablet (40 mg total) by mouth daily. 90 tablet 3   amoxicillin -clavulanate (AUGMENTIN ) 875-125 MG tablet Take 1 tablet by mouth 2 (two) times daily. (Patient not taking: Reported on 11/05/2024) 20 tablet 0   cyanocobalamin (VITAMIN B12) 1000 MCG tablet Take 1 tablet (1,000 mcg total) by mouth daily. (Patient not taking: Reported on 11/05/2024) 90 tablet 3   predniSONE  (DELTASONE ) 50 MG tablet Take 1 tablet daily for 5 days. (Patient not taking: Reported on 08/30/2024) 5 tablet 0   No current facility-administered medications on file prior to visit.   "

## 2024-11-05 NOTE — Patient Instructions (Addendum)
 [name]

## 2024-11-07 ENCOUNTER — Telehealth: Payer: Self-pay

## 2024-11-07 ENCOUNTER — Other Ambulatory Visit (HOSPITAL_COMMUNITY): Payer: Self-pay

## 2024-11-07 NOTE — Telephone Encounter (Signed)
 Pharmacy Patient Advocate Encounter   Received notification from Pt Calls Messages that prior authorization for Ozempic  2mg /25ml is required/requested.   Insurance verification completed.   The patient is insured through [employer].   Per test claim: PA required; PA submitted to above mentioned insurance via Latent Key/confirmation #/EOC [id number] Status is pending

## 2024-11-07 NOTE — Telephone Encounter (Signed)
 Pharmacy Patient Advocate Encounter  Received notification from [employer] that Prior Authorization for Ozempic  2mg /45ml has been APPROVED from 11/07/24 to 11/07/25   PA #/Case ID/Reference #: [id number]

## 2024-11-07 NOTE — Telephone Encounter (Signed)
 Pt needs a prior auth for His ozempic 

## 2024-11-14 ENCOUNTER — Encounter: Payer: Self-pay | Admitting: Internal Medicine

## 2024-11-15 ENCOUNTER — Ambulatory Visit: Admitting: Family Medicine

## 2024-11-23 ENCOUNTER — Other Ambulatory Visit: Payer: Self-pay | Admitting: Family Medicine

## 2024-11-23 DIAGNOSIS — I10 Essential (primary) hypertension: Secondary | ICD-10-CM

## 2024-11-27 ENCOUNTER — Encounter

## 2024-11-29 ENCOUNTER — Ambulatory Visit

## 2024-11-29 NOTE — Progress Notes (Signed)
 RN attempted on two separate occasions to contact patient via telephone to complete his PV prior to upcoming colonoscopy scheduled for 12/11/24. With both attempts, RN was unable to reach patient but left VM requesting that patient call us  back and letting patient know that we need to complete the PV prior to his procedure. [facility] phone number provided in vm.  Patient did not return call; procedure cancelled; letter sent to patient.

## 2024-12-06 NOTE — Progress Notes (Incomplete)
{  ELABTTemplates:[id number]}

## 2024-12-09 ENCOUNTER — Ambulatory Visit: Admitting: Family Medicine

## 2024-12-11 ENCOUNTER — Encounter: Admitting: Internal Medicine

## 2025-01-14 ENCOUNTER — Encounter: Admitting: Skilled Nursing Facility1
